# Patient Record
Sex: Female | Born: 1935 | Hispanic: No | Marital: Married | Smoking: Never smoker
Health system: Southern US, Community
[De-identification: ages and names within clinical notes are randomized; demographics above are authoritative.]

## PROBLEM LIST (undated history)

## (undated) DIAGNOSIS — I10 Essential (primary) hypertension: Secondary | ICD-10-CM

## (undated) DIAGNOSIS — G609 Hereditary and idiopathic neuropathy, unspecified: Secondary | ICD-10-CM

## (undated) DIAGNOSIS — I639 Cerebral infarction, unspecified: Secondary | ICD-10-CM

## (undated) HISTORY — DX: Cerebral infarction, unspecified: I63.9

## (undated) HISTORY — DX: Hereditary and idiopathic neuropathy, unspecified: G60.9

## (undated) HISTORY — PX: LAPAROSCOPIC ASSISTED VAGINAL HYSTERECTOMY: SHX5398

---

## 1997-08-19 ENCOUNTER — Other Ambulatory Visit: Admission: RE | Admit: 1997-08-19 | Discharge: 1997-08-19 | Payer: Self-pay | Admitting: Obstetrics and Gynecology

## 1998-09-15 ENCOUNTER — Other Ambulatory Visit: Admission: RE | Admit: 1998-09-15 | Discharge: 1998-09-15 | Payer: Self-pay | Admitting: Obstetrics and Gynecology

## 1999-10-05 ENCOUNTER — Other Ambulatory Visit: Admission: RE | Admit: 1999-10-05 | Discharge: 1999-10-05 | Payer: Self-pay | Admitting: Obstetrics and Gynecology

## 2000-10-08 ENCOUNTER — Other Ambulatory Visit: Admission: RE | Admit: 2000-10-08 | Discharge: 2000-10-08 | Payer: Self-pay | Admitting: Obstetrics and Gynecology

## 2011-06-14 DIAGNOSIS — D510 Vitamin B12 deficiency anemia due to intrinsic factor deficiency: Secondary | ICD-10-CM | POA: Diagnosis not present

## 2011-07-09 DIAGNOSIS — H2510 Age-related nuclear cataract, unspecified eye: Secondary | ICD-10-CM | POA: Diagnosis not present

## 2011-08-14 DIAGNOSIS — Z1331 Encounter for screening for depression: Secondary | ICD-10-CM | POA: Diagnosis not present

## 2011-08-14 DIAGNOSIS — G609 Hereditary and idiopathic neuropathy, unspecified: Secondary | ICD-10-CM | POA: Diagnosis not present

## 2011-08-14 DIAGNOSIS — I10 Essential (primary) hypertension: Secondary | ICD-10-CM | POA: Diagnosis not present

## 2011-08-14 DIAGNOSIS — E538 Deficiency of other specified B group vitamins: Secondary | ICD-10-CM | POA: Diagnosis not present

## 2011-08-16 DIAGNOSIS — D510 Vitamin B12 deficiency anemia due to intrinsic factor deficiency: Secondary | ICD-10-CM | POA: Diagnosis not present

## 2011-08-17 DIAGNOSIS — D239 Other benign neoplasm of skin, unspecified: Secondary | ICD-10-CM | POA: Diagnosis not present

## 2011-08-17 DIAGNOSIS — Z85828 Personal history of other malignant neoplasm of skin: Secondary | ICD-10-CM | POA: Diagnosis not present

## 2011-08-17 DIAGNOSIS — L821 Other seborrheic keratosis: Secondary | ICD-10-CM | POA: Diagnosis not present

## 2011-08-30 DIAGNOSIS — Z1231 Encounter for screening mammogram for malignant neoplasm of breast: Secondary | ICD-10-CM | POA: Diagnosis not present

## 2011-09-20 DIAGNOSIS — D510 Vitamin B12 deficiency anemia due to intrinsic factor deficiency: Secondary | ICD-10-CM | POA: Diagnosis not present

## 2011-10-22 DIAGNOSIS — D510 Vitamin B12 deficiency anemia due to intrinsic factor deficiency: Secondary | ICD-10-CM | POA: Diagnosis not present

## 2011-11-22 DIAGNOSIS — D510 Vitamin B12 deficiency anemia due to intrinsic factor deficiency: Secondary | ICD-10-CM | POA: Diagnosis not present

## 2011-12-24 DIAGNOSIS — D510 Vitamin B12 deficiency anemia due to intrinsic factor deficiency: Secondary | ICD-10-CM | POA: Diagnosis not present

## 2011-12-24 DIAGNOSIS — N390 Urinary tract infection, site not specified: Secondary | ICD-10-CM | POA: Diagnosis not present

## 2012-01-28 DIAGNOSIS — D510 Vitamin B12 deficiency anemia due to intrinsic factor deficiency: Secondary | ICD-10-CM | POA: Diagnosis not present

## 2012-02-18 DIAGNOSIS — I10 Essential (primary) hypertension: Secondary | ICD-10-CM | POA: Diagnosis not present

## 2012-02-18 DIAGNOSIS — M899 Disorder of bone, unspecified: Secondary | ICD-10-CM | POA: Diagnosis not present

## 2012-02-18 DIAGNOSIS — G609 Hereditary and idiopathic neuropathy, unspecified: Secondary | ICD-10-CM | POA: Diagnosis not present

## 2012-02-18 DIAGNOSIS — M949 Disorder of cartilage, unspecified: Secondary | ICD-10-CM | POA: Diagnosis not present

## 2012-02-18 DIAGNOSIS — Z23 Encounter for immunization: Secondary | ICD-10-CM | POA: Diagnosis not present

## 2012-02-28 DIAGNOSIS — D510 Vitamin B12 deficiency anemia due to intrinsic factor deficiency: Secondary | ICD-10-CM | POA: Diagnosis not present

## 2012-04-24 DIAGNOSIS — D510 Vitamin B12 deficiency anemia due to intrinsic factor deficiency: Secondary | ICD-10-CM | POA: Diagnosis not present

## 2012-06-24 DIAGNOSIS — D510 Vitamin B12 deficiency anemia due to intrinsic factor deficiency: Secondary | ICD-10-CM | POA: Diagnosis not present

## 2012-07-09 DIAGNOSIS — Z1289 Encounter for screening for malignant neoplasm of other sites: Secondary | ICD-10-CM | POA: Diagnosis not present

## 2012-07-25 DIAGNOSIS — Z1211 Encounter for screening for malignant neoplasm of colon: Secondary | ICD-10-CM | POA: Diagnosis not present

## 2012-08-21 DIAGNOSIS — D219 Benign neoplasm of connective and other soft tissue, unspecified: Secondary | ICD-10-CM | POA: Diagnosis not present

## 2012-08-21 DIAGNOSIS — Z85828 Personal history of other malignant neoplasm of skin: Secondary | ICD-10-CM | POA: Diagnosis not present

## 2012-08-21 DIAGNOSIS — L821 Other seborrheic keratosis: Secondary | ICD-10-CM | POA: Diagnosis not present

## 2012-08-21 DIAGNOSIS — D485 Neoplasm of uncertain behavior of skin: Secondary | ICD-10-CM | POA: Diagnosis not present

## 2012-08-21 DIAGNOSIS — D239 Other benign neoplasm of skin, unspecified: Secondary | ICD-10-CM | POA: Diagnosis not present

## 2012-08-27 DIAGNOSIS — Z1331 Encounter for screening for depression: Secondary | ICD-10-CM | POA: Diagnosis not present

## 2012-08-27 DIAGNOSIS — I10 Essential (primary) hypertension: Secondary | ICD-10-CM | POA: Diagnosis not present

## 2012-08-27 DIAGNOSIS — D510 Vitamin B12 deficiency anemia due to intrinsic factor deficiency: Secondary | ICD-10-CM | POA: Diagnosis not present

## 2012-08-27 DIAGNOSIS — M899 Disorder of bone, unspecified: Secondary | ICD-10-CM | POA: Diagnosis not present

## 2012-09-24 DIAGNOSIS — Z1231 Encounter for screening mammogram for malignant neoplasm of breast: Secondary | ICD-10-CM | POA: Diagnosis not present

## 2012-10-20 DIAGNOSIS — H2510 Age-related nuclear cataract, unspecified eye: Secondary | ICD-10-CM | POA: Diagnosis not present

## 2012-11-10 DIAGNOSIS — D510 Vitamin B12 deficiency anemia due to intrinsic factor deficiency: Secondary | ICD-10-CM | POA: Diagnosis not present

## 2013-01-12 DIAGNOSIS — D510 Vitamin B12 deficiency anemia due to intrinsic factor deficiency: Secondary | ICD-10-CM | POA: Diagnosis not present

## 2013-03-02 DIAGNOSIS — Z23 Encounter for immunization: Secondary | ICD-10-CM | POA: Diagnosis not present

## 2013-03-02 DIAGNOSIS — M899 Disorder of bone, unspecified: Secondary | ICD-10-CM | POA: Diagnosis not present

## 2013-03-02 DIAGNOSIS — I10 Essential (primary) hypertension: Secondary | ICD-10-CM | POA: Diagnosis not present

## 2013-05-13 DIAGNOSIS — D510 Vitamin B12 deficiency anemia due to intrinsic factor deficiency: Secondary | ICD-10-CM | POA: Diagnosis not present

## 2013-07-06 DIAGNOSIS — D510 Vitamin B12 deficiency anemia due to intrinsic factor deficiency: Secondary | ICD-10-CM | POA: Diagnosis not present

## 2013-08-31 DIAGNOSIS — K219 Gastro-esophageal reflux disease without esophagitis: Secondary | ICD-10-CM | POA: Diagnosis not present

## 2013-08-31 DIAGNOSIS — Z1331 Encounter for screening for depression: Secondary | ICD-10-CM | POA: Diagnosis not present

## 2013-08-31 DIAGNOSIS — D510 Vitamin B12 deficiency anemia due to intrinsic factor deficiency: Secondary | ICD-10-CM | POA: Diagnosis not present

## 2013-08-31 DIAGNOSIS — I10 Essential (primary) hypertension: Secondary | ICD-10-CM | POA: Diagnosis not present

## 2013-09-09 DIAGNOSIS — L821 Other seborrheic keratosis: Secondary | ICD-10-CM | POA: Diagnosis not present

## 2013-09-09 DIAGNOSIS — D485 Neoplasm of uncertain behavior of skin: Secondary | ICD-10-CM | POA: Diagnosis not present

## 2013-09-09 DIAGNOSIS — D239 Other benign neoplasm of skin, unspecified: Secondary | ICD-10-CM | POA: Diagnosis not present

## 2013-09-09 DIAGNOSIS — D2370 Other benign neoplasm of skin of unspecified lower limb, including hip: Secondary | ICD-10-CM | POA: Diagnosis not present

## 2013-09-09 DIAGNOSIS — L739 Follicular disorder, unspecified: Secondary | ICD-10-CM | POA: Diagnosis not present

## 2013-09-09 DIAGNOSIS — L570 Actinic keratosis: Secondary | ICD-10-CM | POA: Diagnosis not present

## 2013-09-09 DIAGNOSIS — Z85828 Personal history of other malignant neoplasm of skin: Secondary | ICD-10-CM | POA: Diagnosis not present

## 2013-09-30 DIAGNOSIS — Z1231 Encounter for screening mammogram for malignant neoplasm of breast: Secondary | ICD-10-CM | POA: Diagnosis not present

## 2013-10-28 DIAGNOSIS — N951 Menopausal and female climacteric states: Secondary | ICD-10-CM | POA: Diagnosis not present

## 2013-11-09 DIAGNOSIS — D510 Vitamin B12 deficiency anemia due to intrinsic factor deficiency: Secondary | ICD-10-CM | POA: Diagnosis not present

## 2013-11-11 DIAGNOSIS — L570 Actinic keratosis: Secondary | ICD-10-CM | POA: Diagnosis not present

## 2013-11-11 DIAGNOSIS — L820 Inflamed seborrheic keratosis: Secondary | ICD-10-CM | POA: Diagnosis not present

## 2014-01-12 DIAGNOSIS — D510 Vitamin B12 deficiency anemia due to intrinsic factor deficiency: Secondary | ICD-10-CM | POA: Diagnosis not present

## 2014-03-03 DIAGNOSIS — H10023 Other mucopurulent conjunctivitis, bilateral: Secondary | ICD-10-CM | POA: Diagnosis not present

## 2014-03-17 DIAGNOSIS — Z23 Encounter for immunization: Secondary | ICD-10-CM | POA: Diagnosis not present

## 2014-03-17 DIAGNOSIS — D510 Vitamin B12 deficiency anemia due to intrinsic factor deficiency: Secondary | ICD-10-CM | POA: Diagnosis not present

## 2014-03-17 DIAGNOSIS — D81819 Biotin-dependent carboxylase deficiency, unspecified: Secondary | ICD-10-CM | POA: Diagnosis not present

## 2014-03-17 DIAGNOSIS — J309 Allergic rhinitis, unspecified: Secondary | ICD-10-CM | POA: Diagnosis not present

## 2014-03-17 DIAGNOSIS — I10 Essential (primary) hypertension: Secondary | ICD-10-CM | POA: Diagnosis not present

## 2014-03-22 DIAGNOSIS — D2372 Other benign neoplasm of skin of left lower limb, including hip: Secondary | ICD-10-CM | POA: Diagnosis not present

## 2014-03-22 DIAGNOSIS — Z85828 Personal history of other malignant neoplasm of skin: Secondary | ICD-10-CM | POA: Diagnosis not present

## 2014-03-22 DIAGNOSIS — T881XXD Other complications following immunization, not elsewhere classified, subsequent encounter: Secondary | ICD-10-CM | POA: Diagnosis not present

## 2014-03-22 DIAGNOSIS — D1801 Hemangioma of skin and subcutaneous tissue: Secondary | ICD-10-CM | POA: Diagnosis not present

## 2014-03-22 DIAGNOSIS — D239 Other benign neoplasm of skin, unspecified: Secondary | ICD-10-CM | POA: Diagnosis not present

## 2014-05-19 DIAGNOSIS — D510 Vitamin B12 deficiency anemia due to intrinsic factor deficiency: Secondary | ICD-10-CM | POA: Diagnosis not present

## 2014-07-19 DIAGNOSIS — D510 Vitamin B12 deficiency anemia due to intrinsic factor deficiency: Secondary | ICD-10-CM | POA: Diagnosis not present

## 2014-09-23 DIAGNOSIS — G609 Hereditary and idiopathic neuropathy, unspecified: Secondary | ICD-10-CM | POA: Diagnosis not present

## 2014-09-23 DIAGNOSIS — I10 Essential (primary) hypertension: Secondary | ICD-10-CM | POA: Diagnosis not present

## 2014-09-23 DIAGNOSIS — D81819 Biotin-dependent carboxylase deficiency, unspecified: Secondary | ICD-10-CM | POA: Diagnosis not present

## 2014-10-07 DIAGNOSIS — Z1231 Encounter for screening mammogram for malignant neoplasm of breast: Secondary | ICD-10-CM | POA: Diagnosis not present

## 2015-02-24 DIAGNOSIS — Z85828 Personal history of other malignant neoplasm of skin: Secondary | ICD-10-CM | POA: Diagnosis not present

## 2015-02-24 DIAGNOSIS — D1800 Hemangioma unspecified site: Secondary | ICD-10-CM | POA: Diagnosis not present

## 2015-02-24 DIAGNOSIS — D2262 Melanocytic nevi of left upper limb, including shoulder: Secondary | ICD-10-CM | POA: Diagnosis not present

## 2015-02-24 DIAGNOSIS — D225 Melanocytic nevi of trunk: Secondary | ICD-10-CM | POA: Diagnosis not present

## 2015-02-24 DIAGNOSIS — D2372 Other benign neoplasm of skin of left lower limb, including hip: Secondary | ICD-10-CM | POA: Diagnosis not present

## 2015-03-03 DIAGNOSIS — Z23 Encounter for immunization: Secondary | ICD-10-CM | POA: Diagnosis not present

## 2015-03-08 DIAGNOSIS — H16223 Keratoconjunctivitis sicca, not specified as Sjogren's, bilateral: Secondary | ICD-10-CM | POA: Diagnosis not present

## 2015-03-28 DIAGNOSIS — E538 Deficiency of other specified B group vitamins: Secondary | ICD-10-CM | POA: Diagnosis not present

## 2015-03-28 DIAGNOSIS — I10 Essential (primary) hypertension: Secondary | ICD-10-CM | POA: Diagnosis not present

## 2015-03-28 DIAGNOSIS — G609 Hereditary and idiopathic neuropathy, unspecified: Secondary | ICD-10-CM | POA: Diagnosis not present

## 2015-04-13 DIAGNOSIS — N951 Menopausal and female climacteric states: Secondary | ICD-10-CM | POA: Diagnosis not present

## 2015-05-24 DIAGNOSIS — L408 Other psoriasis: Secondary | ICD-10-CM | POA: Diagnosis not present

## 2015-06-01 DIAGNOSIS — H16223 Keratoconjunctivitis sicca, not specified as Sjogren's, bilateral: Secondary | ICD-10-CM | POA: Diagnosis not present

## 2015-06-23 DIAGNOSIS — L408 Other psoriasis: Secondary | ICD-10-CM | POA: Diagnosis not present

## 2015-07-04 DIAGNOSIS — D510 Vitamin B12 deficiency anemia due to intrinsic factor deficiency: Secondary | ICD-10-CM | POA: Diagnosis not present

## 2015-09-01 DIAGNOSIS — D510 Vitamin B12 deficiency anemia due to intrinsic factor deficiency: Secondary | ICD-10-CM | POA: Diagnosis not present

## 2015-09-27 DIAGNOSIS — G609 Hereditary and idiopathic neuropathy, unspecified: Secondary | ICD-10-CM | POA: Diagnosis not present

## 2015-09-27 DIAGNOSIS — Z1389 Encounter for screening for other disorder: Secondary | ICD-10-CM | POA: Diagnosis not present

## 2015-09-27 DIAGNOSIS — Z0000 Encounter for general adult medical examination without abnormal findings: Secondary | ICD-10-CM | POA: Diagnosis not present

## 2015-09-27 DIAGNOSIS — I10 Essential (primary) hypertension: Secondary | ICD-10-CM | POA: Diagnosis not present

## 2015-10-10 DIAGNOSIS — Z1231 Encounter for screening mammogram for malignant neoplasm of breast: Secondary | ICD-10-CM | POA: Diagnosis not present

## 2015-10-31 DIAGNOSIS — D510 Vitamin B12 deficiency anemia due to intrinsic factor deficiency: Secondary | ICD-10-CM | POA: Diagnosis not present

## 2015-12-14 DIAGNOSIS — J209 Acute bronchitis, unspecified: Secondary | ICD-10-CM | POA: Diagnosis not present

## 2016-01-02 DIAGNOSIS — D510 Vitamin B12 deficiency anemia due to intrinsic factor deficiency: Secondary | ICD-10-CM | POA: Diagnosis not present

## 2016-01-03 DIAGNOSIS — H2513 Age-related nuclear cataract, bilateral: Secondary | ICD-10-CM | POA: Diagnosis not present

## 2016-01-03 DIAGNOSIS — H04123 Dry eye syndrome of bilateral lacrimal glands: Secondary | ICD-10-CM | POA: Diagnosis not present

## 2016-01-19 DIAGNOSIS — Z01818 Encounter for other preprocedural examination: Secondary | ICD-10-CM | POA: Diagnosis not present

## 2016-01-19 DIAGNOSIS — H2513 Age-related nuclear cataract, bilateral: Secondary | ICD-10-CM | POA: Diagnosis not present

## 2016-01-23 DIAGNOSIS — H2511 Age-related nuclear cataract, right eye: Secondary | ICD-10-CM | POA: Diagnosis not present

## 2016-01-23 DIAGNOSIS — H25811 Combined forms of age-related cataract, right eye: Secondary | ICD-10-CM | POA: Diagnosis not present

## 2016-02-29 DIAGNOSIS — D2372 Other benign neoplasm of skin of left lower limb, including hip: Secondary | ICD-10-CM | POA: Diagnosis not present

## 2016-02-29 DIAGNOSIS — D2262 Melanocytic nevi of left upper limb, including shoulder: Secondary | ICD-10-CM | POA: Diagnosis not present

## 2016-02-29 DIAGNOSIS — Z85828 Personal history of other malignant neoplasm of skin: Secondary | ICD-10-CM | POA: Diagnosis not present

## 2016-02-29 DIAGNOSIS — D225 Melanocytic nevi of trunk: Secondary | ICD-10-CM | POA: Diagnosis not present

## 2016-02-29 DIAGNOSIS — L821 Other seborrheic keratosis: Secondary | ICD-10-CM | POA: Diagnosis not present

## 2016-02-29 DIAGNOSIS — Z23 Encounter for immunization: Secondary | ICD-10-CM | POA: Diagnosis not present

## 2016-03-01 DIAGNOSIS — Z23 Encounter for immunization: Secondary | ICD-10-CM | POA: Diagnosis not present

## 2016-03-12 DIAGNOSIS — D510 Vitamin B12 deficiency anemia due to intrinsic factor deficiency: Secondary | ICD-10-CM | POA: Diagnosis not present

## 2016-03-22 DIAGNOSIS — H25812 Combined forms of age-related cataract, left eye: Secondary | ICD-10-CM | POA: Diagnosis not present

## 2016-03-22 DIAGNOSIS — H2512 Age-related nuclear cataract, left eye: Secondary | ICD-10-CM | POA: Diagnosis not present

## 2016-04-05 DIAGNOSIS — I10 Essential (primary) hypertension: Secondary | ICD-10-CM | POA: Diagnosis not present

## 2016-04-16 DIAGNOSIS — I10 Essential (primary) hypertension: Secondary | ICD-10-CM | POA: Diagnosis not present

## 2016-04-27 DIAGNOSIS — J209 Acute bronchitis, unspecified: Secondary | ICD-10-CM | POA: Diagnosis not present

## 2016-05-08 DIAGNOSIS — Z1289 Encounter for screening for malignant neoplasm of other sites: Secondary | ICD-10-CM | POA: Diagnosis not present

## 2016-05-14 DIAGNOSIS — D510 Vitamin B12 deficiency anemia due to intrinsic factor deficiency: Secondary | ICD-10-CM | POA: Diagnosis not present

## 2016-07-16 DIAGNOSIS — D510 Vitamin B12 deficiency anemia due to intrinsic factor deficiency: Secondary | ICD-10-CM | POA: Diagnosis not present

## 2016-09-18 DIAGNOSIS — J209 Acute bronchitis, unspecified: Secondary | ICD-10-CM | POA: Diagnosis not present

## 2016-10-10 DIAGNOSIS — Z1389 Encounter for screening for other disorder: Secondary | ICD-10-CM | POA: Diagnosis not present

## 2016-10-10 DIAGNOSIS — I10 Essential (primary) hypertension: Secondary | ICD-10-CM | POA: Diagnosis not present

## 2016-10-10 DIAGNOSIS — Z0000 Encounter for general adult medical examination without abnormal findings: Secondary | ICD-10-CM | POA: Diagnosis not present

## 2016-10-10 DIAGNOSIS — D510 Vitamin B12 deficiency anemia due to intrinsic factor deficiency: Secondary | ICD-10-CM | POA: Diagnosis not present

## 2016-12-10 DIAGNOSIS — E538 Deficiency of other specified B group vitamins: Secondary | ICD-10-CM | POA: Diagnosis not present

## 2016-12-10 DIAGNOSIS — I8392 Asymptomatic varicose veins of left lower extremity: Secondary | ICD-10-CM | POA: Diagnosis not present

## 2017-01-23 DIAGNOSIS — Z1231 Encounter for screening mammogram for malignant neoplasm of breast: Secondary | ICD-10-CM | POA: Diagnosis not present

## 2017-02-11 DIAGNOSIS — D510 Vitamin B12 deficiency anemia due to intrinsic factor deficiency: Secondary | ICD-10-CM | POA: Diagnosis not present

## 2017-02-11 DIAGNOSIS — Z23 Encounter for immunization: Secondary | ICD-10-CM | POA: Diagnosis not present

## 2017-02-28 DIAGNOSIS — L738 Other specified follicular disorders: Secondary | ICD-10-CM | POA: Diagnosis not present

## 2017-02-28 DIAGNOSIS — D2372 Other benign neoplasm of skin of left lower limb, including hip: Secondary | ICD-10-CM | POA: Diagnosis not present

## 2017-02-28 DIAGNOSIS — D1721 Benign lipomatous neoplasm of skin and subcutaneous tissue of right arm: Secondary | ICD-10-CM | POA: Diagnosis not present

## 2017-02-28 DIAGNOSIS — D2262 Melanocytic nevi of left upper limb, including shoulder: Secondary | ICD-10-CM | POA: Diagnosis not present

## 2017-02-28 DIAGNOSIS — Z23 Encounter for immunization: Secondary | ICD-10-CM | POA: Diagnosis not present

## 2017-02-28 DIAGNOSIS — D225 Melanocytic nevi of trunk: Secondary | ICD-10-CM | POA: Diagnosis not present

## 2017-02-28 DIAGNOSIS — B353 Tinea pedis: Secondary | ICD-10-CM | POA: Diagnosis not present

## 2017-02-28 DIAGNOSIS — L700 Acne vulgaris: Secondary | ICD-10-CM | POA: Diagnosis not present

## 2017-02-28 DIAGNOSIS — I8390 Asymptomatic varicose veins of unspecified lower extremity: Secondary | ICD-10-CM | POA: Diagnosis not present

## 2017-02-28 DIAGNOSIS — R609 Edema, unspecified: Secondary | ICD-10-CM | POA: Diagnosis not present

## 2017-02-28 DIAGNOSIS — L821 Other seborrheic keratosis: Secondary | ICD-10-CM | POA: Diagnosis not present

## 2017-04-08 DIAGNOSIS — E538 Deficiency of other specified B group vitamins: Secondary | ICD-10-CM | POA: Diagnosis not present

## 2017-04-08 DIAGNOSIS — I10 Essential (primary) hypertension: Secondary | ICD-10-CM | POA: Diagnosis not present

## 2017-04-08 DIAGNOSIS — D510 Vitamin B12 deficiency anemia due to intrinsic factor deficiency: Secondary | ICD-10-CM | POA: Diagnosis not present

## 2017-06-03 DIAGNOSIS — H2703 Aphakia, bilateral: Secondary | ICD-10-CM | POA: Diagnosis not present

## 2017-06-03 DIAGNOSIS — H04123 Dry eye syndrome of bilateral lacrimal glands: Secondary | ICD-10-CM | POA: Diagnosis not present

## 2017-07-01 DIAGNOSIS — D510 Vitamin B12 deficiency anemia due to intrinsic factor deficiency: Secondary | ICD-10-CM | POA: Diagnosis not present

## 2017-08-26 DIAGNOSIS — D510 Vitamin B12 deficiency anemia due to intrinsic factor deficiency: Secondary | ICD-10-CM | POA: Diagnosis not present

## 2017-10-21 DIAGNOSIS — I10 Essential (primary) hypertension: Secondary | ICD-10-CM | POA: Diagnosis not present

## 2017-10-21 DIAGNOSIS — Z1389 Encounter for screening for other disorder: Secondary | ICD-10-CM | POA: Diagnosis not present

## 2017-10-21 DIAGNOSIS — Z0000 Encounter for general adult medical examination without abnormal findings: Secondary | ICD-10-CM | POA: Diagnosis not present

## 2017-10-21 DIAGNOSIS — D510 Vitamin B12 deficiency anemia due to intrinsic factor deficiency: Secondary | ICD-10-CM | POA: Diagnosis not present

## 2017-10-21 DIAGNOSIS — M8580 Other specified disorders of bone density and structure, unspecified site: Secondary | ICD-10-CM | POA: Diagnosis not present

## 2017-10-28 DIAGNOSIS — I10 Essential (primary) hypertension: Secondary | ICD-10-CM | POA: Diagnosis not present

## 2017-12-16 DIAGNOSIS — M8588 Other specified disorders of bone density and structure, other site: Secondary | ICD-10-CM | POA: Diagnosis not present

## 2017-12-16 DIAGNOSIS — E538 Deficiency of other specified B group vitamins: Secondary | ICD-10-CM | POA: Diagnosis not present

## 2018-01-02 ENCOUNTER — Inpatient Hospital Stay (HOSPITAL_COMMUNITY): Payer: Medicare Other

## 2018-01-02 ENCOUNTER — Emergency Department (HOSPITAL_COMMUNITY): Payer: Medicare Other

## 2018-01-02 ENCOUNTER — Encounter (HOSPITAL_COMMUNITY): Payer: Self-pay | Admitting: *Deleted

## 2018-01-02 ENCOUNTER — Other Ambulatory Visit: Payer: Self-pay

## 2018-01-02 ENCOUNTER — Inpatient Hospital Stay (HOSPITAL_COMMUNITY)
Admission: EM | Admit: 2018-01-02 | Discharge: 2018-01-03 | DRG: 065 | Disposition: A | Discharge status: Home / Self Care | Payer: Medicare Other | Attending: Internal Medicine | Admitting: Internal Medicine

## 2018-01-02 DIAGNOSIS — R29810 Facial weakness: Secondary | ICD-10-CM | POA: Diagnosis present

## 2018-01-02 DIAGNOSIS — Z90710 Acquired absence of both cervix and uterus: Secondary | ICD-10-CM

## 2018-01-02 DIAGNOSIS — I639 Cerebral infarction, unspecified: Secondary | ICD-10-CM | POA: Diagnosis present

## 2018-01-02 DIAGNOSIS — Z888 Allergy status to other drugs, medicaments and biological substances status: Secondary | ICD-10-CM

## 2018-01-02 DIAGNOSIS — R29701 NIHSS score 1: Secondary | ICD-10-CM | POA: Diagnosis present

## 2018-01-02 DIAGNOSIS — I10 Essential (primary) hypertension: Secondary | ICD-10-CM | POA: Diagnosis present

## 2018-01-02 DIAGNOSIS — I6381 Other cerebral infarction due to occlusion or stenosis of small artery: Secondary | ICD-10-CM

## 2018-01-02 DIAGNOSIS — I63312 Cerebral infarction due to thrombosis of left middle cerebral artery: Secondary | ICD-10-CM

## 2018-01-02 DIAGNOSIS — G8191 Hemiplegia, unspecified affecting right dominant side: Secondary | ICD-10-CM | POA: Diagnosis present

## 2018-01-02 DIAGNOSIS — Z79890 Hormone replacement therapy (postmenopausal): Secondary | ICD-10-CM | POA: Diagnosis not present

## 2018-01-02 DIAGNOSIS — E785 Hyperlipidemia, unspecified: Secondary | ICD-10-CM | POA: Diagnosis not present

## 2018-01-02 DIAGNOSIS — I6523 Occlusion and stenosis of bilateral carotid arteries: Secondary | ICD-10-CM | POA: Diagnosis not present

## 2018-01-02 DIAGNOSIS — I340 Nonrheumatic mitral (valve) insufficiency: Secondary | ICD-10-CM | POA: Diagnosis not present

## 2018-01-02 DIAGNOSIS — Z79899 Other long term (current) drug therapy: Secondary | ICD-10-CM

## 2018-01-02 DIAGNOSIS — Z7902 Long term (current) use of antithrombotics/antiplatelets: Secondary | ICD-10-CM | POA: Diagnosis not present

## 2018-01-02 HISTORY — DX: Essential (primary) hypertension: I10

## 2018-01-02 LAB — COMPREHENSIVE METABOLIC PANEL
ALK PHOS: 72 U/L (ref 38–126)
ALT: 17 U/L (ref 0–44)
AST: 24 U/L (ref 15–41)
Albumin: 3.8 g/dL (ref 3.5–5.0)
Anion gap: 9 (ref 5–15)
BUN: 14 mg/dL (ref 8–23)
CALCIUM: 9.4 mg/dL (ref 8.9–10.3)
CO2: 27 mmol/L (ref 22–32)
CREATININE: 0.87 mg/dL (ref 0.44–1.00)
Chloride: 104 mmol/L (ref 98–111)
GFR calc non Af Amer: >60 mL/min (ref >60)
GLUCOSE: 99 mg/dL (ref 70–99)
Potassium: 3.7 mmol/L (ref 3.5–5.1)
SODIUM: 140 mmol/L (ref 135–145)
Total Bilirubin: 0.6 mg/dL (ref 0.3–1.2)
Total Protein: 7.1 g/dL (ref 6.5–8.1)

## 2018-01-02 LAB — DIFFERENTIAL
Abs Immature Granulocytes: 0 10*3/uL (ref 0.0–0.1)
BASOS ABS: 0 10*3/uL (ref 0.0–0.1)
Basophils Relative: 0 %
Eosinophils Absolute: 0 10*3/uL (ref 0.0–0.7)
Eosinophils Relative: 0 %
Immature Granulocytes: 0 %
LYMPHS PCT: 25 %
Lymphs Abs: 2.5 10*3/uL (ref 0.7–4.0)
MONO ABS: 0.8 10*3/uL (ref 0.1–1.0)
Monocytes Relative: 8 %
NEUTROS ABS: 6.5 10*3/uL (ref 1.7–7.7)
Neutrophils Relative %: 67 %

## 2018-01-02 LAB — PROTIME-INR
INR: 0.91
Prothrombin Time: 12.2 seconds (ref 11.4–15.2)

## 2018-01-02 LAB — CBC
HEMATOCRIT: 40.5 % (ref 36.0–46.0)
HEMOGLOBIN: 13.5 g/dL (ref 12.0–15.0)
MCH: 30.4 pg (ref 26.0–34.0)
MCHC: 33.3 g/dL (ref 30.0–36.0)
MCV: 91.2 fL (ref 78.0–100.0)
Platelets: 306 10*3/uL (ref 150–400)
RBC: 4.44 MIL/uL (ref 3.87–5.11)
RDW: 12.8 % (ref 11.5–15.5)
WBC: 9.8 10*3/uL (ref 4.0–10.5)

## 2018-01-02 LAB — I-STAT CHEM 8, ED
BUN: 20 mg/dL (ref 8–23)
CALCIUM ION: 1.18 mmol/L (ref 1.15–1.40)
CHLORIDE: 103 mmol/L (ref 98–111)
CREATININE: 0.9 mg/dL (ref 0.44–1.00)
GLUCOSE: 96 mg/dL (ref 70–99)
HCT: 40 % (ref 36.0–46.0)
Hemoglobin: 13.6 g/dL (ref 12.0–15.0)
Potassium: 3.9 mmol/L (ref 3.5–5.1)
Sodium: 139 mmol/L (ref 135–145)
TCO2: 28 mmol/L (ref 22–32)

## 2018-01-02 LAB — I-STAT TROPONIN, ED: Troponin i, poc: 0 ng/mL (ref 0.00–0.08)

## 2018-01-02 LAB — APTT: APTT: 28 s (ref 24–36)

## 2018-01-02 MED ORDER — SENNOSIDES-DOCUSATE SODIUM 8.6-50 MG PO TABS: 1.0000 | ORAL_TABLET | Freq: Every evening | ORAL | Status: DC | PRN

## 2018-01-02 MED ORDER — ASPIRIN 325 MG PO TABS: 325.0000 mg | ORAL_TABLET | Freq: Every day | ORAL | Status: DC

## 2018-01-02 MED ORDER — ATORVASTATIN CALCIUM 80 MG PO TABS
80.0000 mg | ORAL_TABLET | Freq: Every day | ORAL | Status: DC
  Administered 2018-01-02: 80 mg via ORAL
  Filled 2018-01-02: qty 1

## 2018-01-02 MED ORDER — IOPAMIDOL (ISOVUE-370) INJECTION 76%
50.0000 mL | Freq: Once | INTRAVENOUS | Status: AC | PRN
  Administered 2018-01-02: 50 mL via INTRAVENOUS

## 2018-01-02 MED ORDER — VITAMIN D 1000 UNITS PO TABS
1000.0000 [IU] | ORAL_TABLET | Freq: Every day | ORAL | Status: DC
  Administered 2018-01-03: 1000 [IU] via ORAL
  Filled 2018-01-02: qty 1

## 2018-01-02 MED ORDER — METOPROLOL SUCCINATE ER 100 MG PO TB24: 100.0000 mg | ORAL_TABLET | Freq: Every day | ORAL | Status: DC

## 2018-01-02 MED ORDER — LORATADINE 10 MG PO TABS: 10.0000 mg | ORAL_TABLET | Freq: Every day | ORAL | Status: DC | PRN

## 2018-01-02 MED ORDER — METOPROLOL SUCCINATE ER 25 MG PO TB24
25.0000 mg | ORAL_TABLET | Freq: Every day | ORAL | Status: DC
  Administered 2018-01-03: 25 mg via ORAL
  Filled 2018-01-02: qty 1

## 2018-01-02 MED ORDER — ACETAMINOPHEN 325 MG PO TABS: 650.0000 mg | ORAL_TABLET | ORAL | Status: DC | PRN

## 2018-01-02 MED ORDER — IOPAMIDOL (ISOVUE-370) INJECTION 76%
INTRAVENOUS | Status: AC
  Filled 2018-01-02: qty 50

## 2018-01-02 MED ORDER — STROKE: EARLY STAGES OF RECOVERY BOOK
Freq: Once | Status: DC
  Filled 2018-01-02: qty 1

## 2018-01-02 MED ORDER — HYDRALAZINE HCL 20 MG/ML IJ SOLN: 10.0000 mg | Freq: Four times a day (QID) | INTRAMUSCULAR | Status: DC | PRN

## 2018-01-02 MED ORDER — ACETAMINOPHEN 160 MG/5ML PO SOLN: 650.0000 mg | ORAL | Status: DC | PRN

## 2018-01-02 MED ORDER — ENOXAPARIN SODIUM 40 MG/0.4ML ~~LOC~~ SOLN
40.0000 mg | SUBCUTANEOUS | Status: DC
  Administered 2018-01-02: 40 mg via SUBCUTANEOUS
  Filled 2018-01-02: qty 0.4

## 2018-01-02 MED ORDER — ACETAMINOPHEN 650 MG RE SUPP: 650.0000 mg | RECTAL | Status: DC | PRN

## 2018-01-02 MED ORDER — ASPIRIN 300 MG RE SUPP: 300.0000 mg | Freq: Every day | RECTAL | Status: DC

## 2018-01-02 MED ORDER — CALCIUM CARBONATE-VITAMIN D 500-200 MG-UNIT PO TABS
1.0000 | ORAL_TABLET | Freq: Every day | ORAL | Status: DC
  Administered 2018-01-03: 1 via ORAL
  Filled 2018-01-02: qty 1

## 2018-01-02 NOTE — H&P (Signed)
History and Physical    DOA: 01/02/2018  PCP: Lavone Orn, MD  Patient coming from: Home  Chief Complaint: right sided weakness  HPI: CADENCE MINTON is a 83 y.o. female with history h/o hypertension who is on estrogen supplementation since her hysterectomy 35 years back, presents now with c/o right sided weakness.  Patient states, at baseline, she has some essential tremors in her right hand due to which she has difficulty holding things sometimes.  She woke up Sunday morning feeling heavy and weak in her right upper extremity/forearm which she related to possibly sleeping on that side.  She noticed that she had to use her left hand to move/coordinate her right hand to her face while make-up.  She figured that the symptoms would resolve over time.  She however noticed some right lower extremity weakness as well this morning which concerned her and brought her to the ED.  Husband bedside and reports noticing mild right facial droop on Monday.  Patient denies any dysphagia or dysarthria.  She denies any chest pain, shortness breath, palpitations.  She denies any history of hot flashes or osteoporosis.  She states she never stopped taking estrogen supplementation that was started 35 years back after her hysterectomy.  Denies any history of blood clots.  ED work-up revealed normal labs and CT scan showed a left lacunar infarct.  Patient seen by neurology and recommended admission for further stroke work-up.   Review of Systems: As per HPI otherwise 10 point review of systems negative.    Past Medical History:  Diagnosis Date  . Hypertension     History reviewed. No pertinent surgical history.  Social history:  reports that she has never smoked. She has never used smokeless tobacco. Her alcohol and drug histories are not on file.   Allergies  Allergen Reactions  . Lisinopril Swelling    Face was numb, lips was swollen.  . Prednisone Swelling    Face turns red.    Family History    Problem Relation Age of Onset  . Hypertension Mother   . Hypertension Father       Prior to Admission medications   Medication Sig Start Date End Date Taking? Authorizing Provider  Calcium Carb-Cholecalciferol (OS-CAL EXTRA D3) 500-600 MG-UNIT TABS Take 1 tablet by mouth daily.   Yes [provider]  cholecalciferol (VITAMIN D) 1000 units tablet Take 1,000 Units by mouth daily.   Yes [provider]  estradiol (ESTRACE) 1 MG tablet Take 1 mg by mouth daily.   Yes [provider]  fluticasone (FLONASE) 50 MCG/ACT nasal spray Place 2 sprays into both nostrils daily as needed for allergies or rhinitis.   Yes [provider]  loratadine (CLARITIN) 10 MG tablet Take 10 mg by mouth daily as needed for allergies.    Yes [provider]  metoprolol succinate (TOPROL-XL) 100 MG 24 hr tablet Take 100 mg by mouth daily.   Yes [provider]    Physical Exam: Vitals:   01/02/18 1245 01/02/18 1430 01/02/18 1515 01/02/18 1715  BP: (!) 170/66  (!) 175/70 (!) 181/69  Pulse: 69  74 67  Resp: 12  13 11   Temp:  98.1 F (36.7 C)    TempSrc:      SpO2: 98%  97% 98%    Constitutional: NAD, calm, comfortable Vitals:   01/02/18 1245 01/02/18 1430 01/02/18 1515 01/02/18 1715  BP: (!) 170/66  (!) 175/70 (!) 181/69  Pulse: 69  74 67  Resp:  12  13 11   Temp:  98.1 F (36.7 C)    TempSrc:      SpO2: 98%  97% 98%   Eyes: PERRL, lids and conjunctivae normal ENMT: Mucous membranes are moist. Posterior pharynx clear of any exudate or lesions.Normal dentition.  Neck: normal, supple, no masses, no thyromegaly Respiratory: clear to auscultation bilaterally, no wheezing, no crackles. Normal respiratory effort. No accessory muscle use.  Cardiovascular: Regular rate and rhythm, no murmurs / rubs / gallops. No extremity edema. 2+ pedal pulses. No carotid bruits.  Abdomen: no tenderness, no masses palpated. No hepatosplenomegaly. Bowel sounds positive.   Musculoskeletal: no clubbing / cyanosis. No joint deformity upper and lower extremities. Good ROM, no contractures. Normal muscle tone.  Neurologic: Right facial droop, mild.  No ptosis.  No tongue deviation.  Right upper extremity strength 3-4 over 5, right lower extremity strength 5/5.  Finger-to-nose shows discoordination but likely due to weakness in the right hand. Psychiatric: Normal judgment and insight. Alert and oriented x 3. Normal mood.  SKIN/catheters: no rashes, lesions, ulcers. No induration  Labs on Admission: I have personally reviewed following labs and imaging studies  CBC: Recent Labs  Lab 01/02/18 1220 01/02/18 1236  WBC 9.8  --   NEUTROABS 6.5  --   HGB 13.5 13.6  HCT 40.5 40.0  MCV 91.2  --   PLT 306  --    Basic Metabolic Panel: Recent Labs  Lab 01/02/18 1220 01/02/18 1236  NA 140 139  K 3.7 3.9  CL 104 103  CO2 27  --   GLUCOSE 99 96  BUN 14 20  CREATININE 0.87 0.90  CALCIUM 9.4  --    GFR: CrCl cannot be calculated (Unknown ideal weight.). Liver Function Tests: Recent Labs  Lab 01/02/18 1220  AST 24  ALT 17  ALKPHOS 72  BILITOT 0.6  PROT 7.1  ALBUMIN 3.8   No results for input(s): LIPASE, AMYLASE in the last 168 hours. No results for input(s): AMMONIA in the last 168 hours. Coagulation Profile: Recent Labs  Lab 01/02/18 1220  INR 0.91   Cardiac Enzymes: No results for input(s): CKTOTAL, CKMB, CKMBINDEX, TROPONINI in the last 168 hours. BNP (last 3 results) No results for input(s): PROBNP in the last 8760 hours. HbA1C: No results for input(s): HGBA1C in the last 72 hours. CBG: No results for input(s): GLUCAP in the last 168 hours. Lipid Profile: No results for input(s): CHOL, HDL, LDLCALC, TRIG, CHOLHDL, LDLDIRECT in the last 72 hours. Thyroid Function Tests: No results for input(s): TSH, T4TOTAL, FREET4, T3FREE, THYROIDAB in the last 72 hours. Anemia Panel: No results for input(s): VITAMINB12, FOLATE, FERRITIN, TIBC, IRON,  RETICCTPCT in the last 72 hours. Urine analysis: No results found for: COLORURINE, APPEARANCEUR, [place], [place], GLUCOSEU, [place], BILIRUBINUR, KETONESUR, PROTEINUR, UROBILINOGEN, NITRITE, LEUKOCYTESUR  Radiological Exams on Admission: Ct Head Wo Contrast  Result Date: 01/02/2018 CLINICAL DATA:  Right arm and leg weakness with right facial droop. Symptoms began Sunday-Monday. EXAM: CT HEAD WITHOUT CONTRAST TECHNIQUE: Contiguous axial images were obtained from the base of the skull through the vertex without intravenous contrast. COMPARISON:  None. FINDINGS: Brain: Small lacunar infarct in the left basal ganglia. No hemorrhage, hydrocephalus, extra-axial collection or mass lesion/mass effect. Mild age related cerebral atrophy. Vascular: Atherosclerotic vascular calcification of the carotid siphons. No hyperdense vessel. Skull: Normal. Negative for fracture or focal lesion. Sinuses/Orbits: No acute finding. Other: None. IMPRESSION: 1. Small age-indeterminate lacunar infarct in the left basal ganglia. Electronically Signed  By: Titus Dubin M.D.   On: 01/02/2018 14:28    EKG: Independently reviewed.  Normal sinus rhythm with no acute ST-T changes     Assessment and Plan:   1.  Acute ischemic CVA: CT head shows small age-indeterminate lacunar infarct in left basal ganglia.  Patient is aspirin nave, will start aspirin at 325 mg with high-dose statins as recommended by neurology.  MRI head/MRA ordered for further evaluation of vasculature.  Given the timeline of events, likely do not need strict permissive hypertension.  Will maintain at goal systolic 1 94-4 70.  Lipid profile in a.m.  Neurochecks.  Echo, PT/OT consult requested  2.  Hypertension: Resume metoprolol at lower dose with hydralazine as needed as described above for blood pressure goals  3.  Post hysterectomy: Estrogen risks likely outweigh the benefits in this 83 year old female.  Advised to discuss with PCP.   DVT prophylaxis:  Lovenox  Code Status: Full code  Family Communication: Discussed with patient and husband bedside. Health care proxy would be her husband Consults called: Neurology Admission status:  Patient admitted as inpatient as anticipated LOS greater than 2 midnights    Guilford Shi MD [facility] Pager [phone]  If 7PM-7AM, please contact night-coverage [email or web address] Password [facility]  01/02/2018, 5:44 PM

## 2018-01-02 NOTE — ED Notes (Signed)
Pt is resting and appears comfortable with family at bedside.  Neuro PA at bedside.  Waiting for a bed assignment

## 2018-01-02 NOTE — ED Triage Notes (Signed)
Pt in c/o right arm weakness and right facial droop since Sunday, on Monday developed right leg weakness, pt called PCP this morning and was told to come in for evaluation, right arm weakness noted and facial droop, pt reports gait changes as well, pt alert and oriented

## 2018-01-02 NOTE — Consult Note (Addendum)
Neurology Consultation  Reason for Consult: Stroke Referring Physician: Sedonia Small  CC: Right arm weakness History is obtained from: Patient  HPI: Ashley Phillips is a 83 y.o. female with history of hypertension.  Patient states that she went to bed at 11:30 at night on Saturday and when she woke on Sunday she noted that her right arm was extremely weak in addition she kept dropping things.  She did not seek any medical attention until today.  She figured that the symptoms would resolve over time however due to and symptoms not resolving and continuing to have problems with fine motor skills patient came to the emergency department today to have further evaluation.  CT scan showed a left lacunar infarct.  LKW: 11:30 PM on 12/28/2017 tpa given?: no, out of the window Premorbid modified Rankin scale (mRS): 0 NIH stroke scale 1 ROS: A 14 point ROS was performed and is negative except as noted in the HPI.    Past Medical History:  Diagnosis Date  . Hypertension      Family History  Problem Relation Age of Onset  . Hypertension Mother   . Hypertension Father      Social History:   reports that she has never smoked. She has never used smokeless tobacco. Her alcohol and drug histories are not on file.  Medications No current facility-administered medications for this encounter.   Current Outpatient Medications:  .  Calcium Carb-Cholecalciferol (OS-CAL EXTRA D3) 500-600 MG-UNIT TABS, Take 1 tablet by mouth daily., Disp: , Rfl:  .  cholecalciferol (VITAMIN D) 1000 units tablet, Take 1,000 Units by mouth daily., Disp: , Rfl:  .  estradiol (ESTRACE) 1 MG tablet, Take 1 mg by mouth daily., Disp: , Rfl:  .  fluticasone (FLONASE) 50 MCG/ACT nasal spray, Place 2 sprays into both nostrils daily as needed for allergies or rhinitis., Disp: , Rfl:  .  loratadine (CLARITIN) 10 MG tablet, Take 10 mg by mouth daily as needed for allergies. , Disp: , Rfl:  .  metoprolol succinate (TOPROL-XL) 100 MG 24 hr  tablet, Take 100 mg by mouth daily., Disp: , Rfl:   Exam: Current vital signs: BP (!) 175/70   Pulse 74   Temp 98.1 F (36.7 C)   Resp 13   SpO2 97%  Vital signs in last 24 hours: Temp:  [98 F (36.7 C)-98.1 F (36.7 C)] 98.1 F (36.7 C) (08/29 1430) Pulse Rate:  [69-79] 74 (08/29 1515) Resp:  [12-20] 13 (08/29 1515) BP: (170-204)/(66-87) 175/70 (08/29 1515) SpO2:  [97 %-100 %] 97 % (08/29 1515) GENERAL: Awake, alert in NAD Ext: warm, well perfused, intact peripheral pulses,  CVS Resp: Abd:  NEURO:  Mental Status: AA&Ox3, speech is clear.  Naming, repetition, fluency, and comprehension intact. Cranial Nerves: PERRL 2 mm/brisk. EOMI, visual fields full, right facial droop at rest, facial sensation intact, hearing intact, tongue/uvula/soft palate midline, normal sternocleidomastoid and trapezius muscle strength.   Motor: 5 out of 5 throughout with the exception of her right arm which is 4/5 Tone: is normal and bulk is normal Sensation- Intact to light touch bilaterally Coordination: Right finger-nose showed some dysmetria secondary to strength otherwise left arm showed no dysmetria in bilateral legs showed no dysmetria or ataxia Gait- deferred  Labs I have reviewed labs in epic and the results pertinent to this consultation are:  CBC    Component Value Date/Time   WBC 9.8 01/02/2018 1220   RBC 4.44 01/02/2018 1220   HGB 13.6 01/02/2018 1236  HCT 40.0 01/02/2018 1236   PLT 306 01/02/2018 1220   MCV 91.2 01/02/2018 1220   MCH 30.4 01/02/2018 1220   MCHC 33.3 01/02/2018 1220   RDW 12.8 01/02/2018 1220   LYMPHSABS 2.5 01/02/2018 1220   MONOABS 0.8 01/02/2018 1220   EOSABS 0.0 01/02/2018 1220   BASOSABS 0.0 01/02/2018 1220  CMP     Component Value Date/Time   NA 139 01/02/2018 1236   K 3.9 01/02/2018 1236   CL 103 01/02/2018 1236   CO2 27 01/02/2018 1220   GLUCOSE 96 01/02/2018 1236   BUN 20 01/02/2018 1236   CREATININE 0.90 01/02/2018 1236   CALCIUM 9.4  01/02/2018 1220   PROT 7.1 01/02/2018 1220   ALBUMIN 3.8 01/02/2018 1220   AST 24 01/02/2018 1220   ALT 17 01/02/2018 1220   ALKPHOS 72 01/02/2018 1220   BILITOT 0.6 01/02/2018 1220   GFRNONAA >60 01/02/2018 1220   GFRAA >60 01/02/2018 1220    Lipid Panel  No results found for: CHOL, TRIG, HDL, CHOLHDL, VLDL, LDLCALC, LDLDIRECT   Imaging I have reviewed the images obtained:  CT-scan of the brain--Small age-indeterminate lacunar infarct in the left basal ganglia  MRI examination of the brain--pending  Assessment: 83 year old female with right arm weakness and CT showing small age-indeterminate lacunar infarct in the left basal ganglia  Recommendations: # MRI of the brain without contrast #MRA Head and neck  #Transthoracic Echo,   # Start patient on ASA 325mg  daily,   #Start  Atorvastatin 80 mg/other high intensity statin if LDL is greater than 70 # BP goal: Goal is 120/80 # HBAIC and Lipid profile # Telemetry monitoring # Frequent neuro checks # NPO until passes stroke swallow screen # please page stroke NP  Or  PA  Or MD from 8am -4 pm  as this patient from this time will be  followed by the stroke.   You can look them up on [email or web address]  Password [facility]  Attending addendum Patient seen and examined on the unit. Agree with the history and physical above in addition to changes made below. Agree with review of systems, which also was independently obtained by me.  MRI imaging and CT Angio of head and neck reviewed by me personally. MRI of the brain shows a 2.3 cm acute ischemic stroke in the posterior left basal ganglia/external capsule.  CTA head and neck does not show any large vessel occlusion.  Relatively minor atherosclerotic changes in the arterial vasculature in the neck. Atheromatous changes in the bilateral PCAs with short segment moderate proximal right P2 stenosis otherwise negative intracranial CTA. Incidental finding of 4 mm left lower lobe pulmonary nodule  indeterminate.  Follow-up by primary in 12 months if patient is high risk. 15 mm precarinal lymph node per report-nonspecific but most likely reactive.  Patient confirmed the history above.  Symptoms ongoing since Sunday.  On examination she is awake alert oriented x3.  Her speech is mildly dysarthric.  Naming, attention repetition intact.  Pupils are equal round reactive to light, extra ocular movements intact, visual fields are full, there is subtle right lower facial weakness and droop, auditory acuity intact, palate elevates symmetrically, shoulder shrug intact and tongue midline.  4/5 strength on the right upper extremity with mild vertical drift.  5/5 strength in the left upper extremity with no drift.  Nearly symmetric antigravity 5/5 lower extremities without vertical drift and either lower extremity.  Intact sensation all over with no extinction.  Coordination exam showed intact finger-nose-finger  bilaterally.  Gait testing was deferred at this time  Imaging independently reviewed as above  Assessment and plan-agree with the assessment and plan documented above. Most likely small vessel etiology stroke in the left basal ganglia.  Needs to be evaluated by cardiac monitoring and echo cardio gram for an embolic source. She might be a candidate for dual antiplatelet therapy going forward- aspirin 325 for now. For now recommendations as above. Stroke team to follow.  -- Amie Portland, MD [facility] Pager: [phone] If 7pm to 7am, please call on call as listed on AMION.

## 2018-01-02 NOTE — Progress Notes (Signed)
Patient arrived on unit in stable condition with husband joking around. Vitals taken and placed in bed. Right arm and weakness noted, as well as a right facial droop.

## 2018-01-02 NOTE — ED Provider Notes (Signed)
[facility] Emergency Department Provider Note MRN:  [id number]  Arrival date & time: 01/02/18     Chief Complaint   Cerebrovascular Accident   History of Present Illness   Ashley Phillips is a 83 y.o. year-old female with a history of hypertension presenting to the ED with chief complaint of right-sided weakness.  Patient explains that she woke up 4 days ago with mild numbness and weakness to the right arm.  Very subtle weakness, trouble with handwriting, consistently dropping objects that she was holding in the right hand.  The next day, patient noticed a very subtle facial droop on the right side.  Denies slurred speech, no trouble swallowing, no headache, no vision change.  And yesterday, patient noticed subtle weakness to the right leg, only minorly affecting her ambulation.  No recent fevers, no chest pain, no shortness of breath, no abdominal pain, no dysuria.  No prior history of stroke.  Review of Systems  A complete 10 system review of systems was obtained and all systems are negative except as noted in the HPI and PMH.   Patient's Health History    Past Medical History:  Diagnosis Date  . Hypertension     History reviewed. No pertinent surgical history.  Family History  Problem Relation Age of Onset  . Hypertension Mother   . Hypertension Father     Social History   Socioeconomic History  . Marital status: Married    Spouse name: Not on file  . Number of children: Not on file  . Years of education: Not on file  . Highest education level: Not on file  Occupational History  . Not on file  Social Needs  . Financial resource strain: Not on file  . Food insecurity:    Worry: Not on file    Inability: Not on file  . Transportation needs:    Medical: Not on file    Non-medical: Not on file  Tobacco Use  . Smoking status: Never Smoker  . Smokeless tobacco: Never Used  Substance and Sexual Activity  . Alcohol use: Not on file  . Drug use: Not on  file  . Sexual activity: Not on file  Lifestyle  . Physical activity:    Days per week: Not on file    Minutes per session: Not on file  . Stress: Not on file  Relationships  . Social connections:    Talks on phone: Not on file    Gets together: Not on file    Attends religious service: Not on file    Active member of club or organization: Not on file    Attends meetings of clubs or organizations: Not on file    Relationship status: Not on file  . Intimate partner violence:    Fear of current or ex partner: Not on file    Emotionally abused: Not on file    Physically abused: Not on file    Forced sexual activity: Not on file  Other Topics Concern  . Not on file  Social History Narrative  . Not on file     Physical Exam  Vital Signs and Nursing Notes reviewed Vitals:   01/02/18 1800 01/02/18 1843  BP: (!) 165/62 (!) 180/72  Pulse: 62 73  Resp: 15 18  Temp:  98 F (36.7 C)  SpO2: 96% 100%    CONSTITUTIONAL: Well-appearing, NAD NEURO:  Alert and oriented x 3, subtle right facial droop with sparing of the forehead EYES:  eyes equal and reactive ENT/NECK:  no LAD, no JVD CARDIO: Regular rate, well-perfused, normal S1 and S2 PULM:  CTAB no wheezing or rhonchi GI/GU:  normal bowel sounds, non-distended, non-tender MSK/SPINE:  No gross deformities, no edema SKIN:  no rash, atraumatic PSYCH:  Appropriate speech and behavior  Diagnostic and Interventional Summary    EKG Interpretation  Date/Time:    Ventricular Rate:    PR Interval:    QRS Duration:   QT Interval:    QTC Calculation:   R Axis:     Text Interpretation:        Labs Reviewed  PROTIME-INR  APTT  CBC  DIFFERENTIAL  COMPREHENSIVE METABOLIC PANEL  HEMOGLOBIN A1C  LIPID PANEL  CBC  CREATININE, SERUM  I-STAT TROPONIN, ED  CBG MONITORING, ED  I-STAT CHEM 8, ED    CT HEAD WO CONTRAST  Final Result    MR BRAIN WO CONTRAST    (Results Pending)  CT ANGIO NECK W OR WO CONTRAST    (Results  Pending)  CT ANGIO HEAD W OR WO CONTRAST    (Results Pending)    Medications  metoprolol succinate (TOPROL-XL) 24 hr tablet 100 mg (has no administration in time range)  Calcium Carb-Cholecalciferol 500-600 MG-UNIT TABS 1 tablet (has no administration in time range)  cholecalciferol (VITAMIN D) tablet 1,000 Units (has no administration in time range)  loratadine (CLARITIN) tablet 10 mg (has no administration in time range)   stroke: mapping our early stages of recovery book (has no administration in time range)  acetaminophen (TYLENOL) tablet 650 mg (has no administration in time range)    Or  acetaminophen (TYLENOL) solution 650 mg (has no administration in time range)    Or  acetaminophen (TYLENOL) suppository 650 mg (has no administration in time range)  senna-docusate (Senokot-S) tablet 1 tablet (has no administration in time range)  enoxaparin (LOVENOX) injection 40 mg (has no administration in time range)  aspirin suppository 300 mg (has no administration in time range)    Or  aspirin tablet 325 mg (has no administration in time range)  iopamidol (ISOVUE-370) 76 % injection (has no administration in time range)     Procedures Critical Care  ED Course and Medical Decision Making  I have reviewed the triage vital signs and the nursing notes.  Pertinent labs & imaging results that were available during my care of the patient were reviewed by me and considered in my medical decision making (see below for details). Clinical Course as of Jan 02 1850  Thu Jan 02, 2018  1302 Concern for completed stroke in this 83 year old female with subtle but present right-sided weakness.  CT head pending, if needed we will follow-up with MRI and/or neurology consultation.   [MB]  1025 CT reveals lacunar infarct of the left basal ganglia, will consult neurology for admission given concern for new stroke.   [MB]    Clinical Course User Index [MB] Maudie Flakes, MD    Evaluated by neurology,  admitted to internal medicine for further care and evaluation.  Barth Kirks. Sedonia Small, [place] [email or web address]  Final Clinical Impressions(s) / ED Diagnoses     ICD-10-CM   1. Lacunar infarct, acute ([place]) I63.81   2. Cerebrovascular accident (CVA), unspecified mechanism [facility]) I63.9     ED Discharge Orders    None         Maudie Flakes, MD 01/02/18 [phone]

## 2018-01-03 ENCOUNTER — Inpatient Hospital Stay (HOSPITAL_COMMUNITY): Payer: Medicare Other

## 2018-01-03 DIAGNOSIS — I340 Nonrheumatic mitral (valve) insufficiency: Secondary | ICD-10-CM

## 2018-01-03 DIAGNOSIS — Z79899 Other long term (current) drug therapy: Secondary | ICD-10-CM

## 2018-01-03 DIAGNOSIS — I10 Essential (primary) hypertension: Secondary | ICD-10-CM

## 2018-01-03 DIAGNOSIS — I63312 Cerebral infarction due to thrombosis of left middle cerebral artery: Secondary | ICD-10-CM

## 2018-01-03 DIAGNOSIS — E785 Hyperlipidemia, unspecified: Secondary | ICD-10-CM

## 2018-01-03 DIAGNOSIS — I6381 Other cerebral infarction due to occlusion or stenosis of small artery: Secondary | ICD-10-CM

## 2018-01-03 LAB — LIPID PANEL
CHOLESTEROL: 239 mg/dL — AB (ref 0–200)
HDL: 64 mg/dL (ref >40)
LDL Cholesterol: 140 mg/dL — ABNORMAL HIGH (ref 0–99)
Total CHOL/HDL Ratio: 3.7 RATIO
Triglycerides: 176 mg/dL — ABNORMAL HIGH (ref <150)
VLDL: 35 mg/dL (ref 0–40)

## 2018-01-03 LAB — ECHOCARDIOGRAM COMPLETE
HEIGHTINCHES: 68 in
WEIGHTICAEL: 2576 [oz_av]

## 2018-01-03 LAB — HEMOGLOBIN A1C
HEMOGLOBIN A1C: 5.5 % (ref 4.8–5.6)
MEAN PLASMA GLUCOSE: 111.15 mg/dL

## 2018-01-03 MED ORDER — ASPIRIN EC 81 MG PO TBEC
81.0000 mg | DELAYED_RELEASE_TABLET | Freq: Every day | ORAL | Status: DC
  Administered 2018-01-03: 81 mg via ORAL
  Filled 2018-01-03: qty 1

## 2018-01-03 MED ORDER — ASPIRIN 81 MG PO TBEC: 81.0000 mg | DELAYED_RELEASE_TABLET | Freq: Every day | ORAL | 0 refills | Status: DC

## 2018-01-03 MED ORDER — CLOPIDOGREL BISULFATE 75 MG PO TABS
75.0000 mg | ORAL_TABLET | Freq: Every day | ORAL | Status: DC
  Administered 2018-01-03: 75 mg via ORAL
  Filled 2018-01-03: qty 1

## 2018-01-03 MED ORDER — ATORVASTATIN CALCIUM 10 MG PO TABS: 20.0000 mg | ORAL_TABLET | Freq: Every day | ORAL | Status: DC

## 2018-01-03 MED ORDER — CLOPIDOGREL BISULFATE 75 MG PO TABS: 75.0000 mg | ORAL_TABLET | Freq: Every day | ORAL | 0 refills | Status: DC

## 2018-01-03 MED ORDER — ATORVASTATIN CALCIUM 20 MG PO TABS: 20.0000 mg | ORAL_TABLET | Freq: Every day | ORAL | 0 refills | Status: AC

## 2018-01-03 MED ORDER — ASPIRIN EC 325 MG PO TBEC: 325.0000 mg | DELAYED_RELEASE_TABLET | Freq: Every day | ORAL | 0 refills | Status: AC

## 2018-01-03 NOTE — Care Management Note (Signed)
Case Management Note  Patient Details  Name: Ashley Phillips MRN: [id number] Date of Birth: 1934/06/26  Subjective/Objective:    Pt admitted with CVA. She is from home with her spouse. Pt has no DME and no issues with transportation or obtaining her medications.                Action/Plan: CM consulted for outpatient therapy. CM met with patient and she would like to attend at [facility]. CM will fax [facility] the needed information. Pt states her spouse can provide supervision at home and transportation to home.   Expected Discharge Date:                  Expected Discharge Plan:  OP Rehab  In-House Referral:     Discharge planning Services  CM Consult  Post Acute Care Choice:    Choice offered to:     DME Arranged:    DME Agency:     HH Arranged:    [place] Agency:     Status of Service:  Completed, signed off  If discussed at [employer] of Stay Meetings, dates discussed:    Additional Comments:  Pollie Friar, RN 01/03/2018, 1:03 PM

## 2018-01-03 NOTE — Progress Notes (Signed)
  Echocardiogram 2D Echocardiogram has been performed.  Ashley Phillips 01/03/2018, 9:51 AM

## 2018-01-03 NOTE — Progress Notes (Signed)
SLP Cancellation Note  Patient Details Name: Ashley Phillips MRN: [id number] DOB: 1934-06-27   Cancelled treatment:       Reason Eval/Treat Not Completed: SLP screened, no needs identified, will sign off.  Pt passed stroke swallow screen and has been tolerating diet without difficulty per RN and pt.  No overt cognitive-linguistic concerns endorsed by PT/OT/RN or pt.  During conversation with therapist, pt had trace right facial droop which did not impact speech intelligibility at all.  Pt also denies any word finding difficulty or other language deficits.  As a result, formal evaluation was deferred as pt is imminently awaiting discharge and appears to be back to baseline for speech, language, cognition, and swallowing.  Discussed with pt that should she experience any difficulty at home in the abovementioned areas that she can reach out to her medical team for an OP ST consult.  All questions were answered to her satisfaction at this time.     Shaylee Stanislawski, Selinda Orion 01/03/2018, 2:24 PM

## 2018-01-03 NOTE — Progress Notes (Signed)
Pt discharge education and instructions completed with pt and spouse at bedside; both voices understanding and denies any questions. Pt IV and telemetry removed; pt discharge home with spouse to transport her home. Pt to pick up electronically sent prescriptions from preferred pharmacy on file. Pt transported off unit via wheelchair with belongings and spouse to the side by volunteer. Delia Heady RN

## 2018-01-03 NOTE — Progress Notes (Signed)
STROKE TEAM PROGRESS NOTE   SUBJECTIVE (INTERVAL HISTORY) No family is at the bedside.  She still has mild right hand weakness but walked well with PT in the hallway. She admitted that she still takes estrogen but probably OK without it. I recommended her to discuss with her GYN.    OBJECTIVE Vitals:   01/03/18 0223 01/03/18 0423 01/03/18 0812 01/03/18 0845  BP: (!) 123/52 (!) 135/49 (!) 159/72 (!) 145/62  Pulse: 68 68 82 81  Resp:  14 11 18   Temp:  (!) 97.4 F (36.3 C) 98.7 F (37.1 C)   TempSrc:  Oral Oral   SpO2: 100% 99% 98% 96%  Weight:      Height:        CBC:  Recent Labs  Lab 01/02/18 1220 01/02/18 1236  WBC 9.8  --   NEUTROABS 6.5  --   HGB 13.5 13.6  HCT 40.5 40.0  MCV 91.2  --   PLT 306  --     Basic Metabolic Panel:  Recent Labs  Lab 01/02/18 1220 01/02/18 1236  NA 140 139  K 3.7 3.9  CL 104 103  CO2 27  --   GLUCOSE 99 96  BUN 14 20  CREATININE 0.87 0.90  CALCIUM 9.4  --     Lipid Panel:     Component Value Date/Time   CHOL 239 (H) 01/03/2018 0439   TRIG 176 (H) 01/03/2018 0439   HDL 64 01/03/2018 0439   CHOLHDL 3.7 01/03/2018 0439   VLDL 35 01/03/2018 0439   LDLCALC 140 (H) 01/03/2018 0439   HgbA1c:  Lab Results  Component Value Date   HGBA1C 5.5 01/03/2018   Urine Drug Screen: No results found for: LABOPIA, COCAINSCRNUR, LABBENZ, AMPHETMU, THCU, LABBARB  Alcohol Level No results found for: ETH  IMAGING  Ct Angio Head W Or Wo Contrast Ct Angio Neck W Or Wo Contrast 01/02/2018 IMPRESSION:  1. Negative CTA for large vessel occlusion.  2. Relatively minor atherosclerotic change involving the major arterial vasculature of the neck for patient age. No hemodynamically significant stenosis.  3. Atheromatous change involving the bilateral PCAs, with single short-segment moderate proximal right P2 stenosis. Otherwise negative intracranial CTA. No other hemodynamically significant or correctable stenosis.  4. 4 mm left lower lobe  pulmonary nodule, indeterminate. No follow-up needed if patient is low-risk. Non-contrast chest CT can be considered in 12 months if patient is high-risk. This recommendation follows the consensus statement: Guidelines for Management of Incidental Pulmonary Nodules Detected on CT Images: From the Fleischner Society 2017; Radiology 2017; 284:228-243.  5. 15 mm precarinal lymph node, nonspecific, but most commonly reactive.   Ct Head Wo Contrast 01/02/2018 IMPRESSION:  1. Small age-indeterminate lacunar infarct in the left basal ganglia.   Mr Brain Wo Contrast 01/02/2018 IMPRESSION:  1. 2.3 cm acute ischemic nonhemorrhagic perforator type infarct involving the posterior left basal ganglia.  2. Mild chronic microvascular ischemic disease.   TTE - Left ventricle: The cavity size was normal. There was mild focal   basal hypertrophy of the septum. Systolic function was normal.   The estimated ejection fraction was in the range of 50% to 55%.   Wall motion was normal; there were no regional wall motion   abnormalities. Features are consistent with a pseudonormal left   ventricular filling pattern, with concomitant abnormal relaxation   and increased filling pressure (grade 2 diastolic dysfunction). - Aortic valve: There was moderate regurgitation. Regurgitation   pressure half-time: 348 ms. - Mitral  valve: There was mild regurgitation. - Right ventricle: Systolic function was normal. - Atrial septum: No defect or patent foramen ovale was identified. - Tricuspid valve: There was trivial regurgitation. - Pulmonic valve: There was trivial regurgitation. - Pulmonary arteries: Systolic pressure was mildly increased. PA   peak pressure: 32 mm Hg (S).   PHYSICAL EXAM  Temp:  [97.4 F (36.3 C)-98.7 F (37.1 C)] 98.7 F (37.1 C) (08/30 0812) Pulse Rate:  [61-82] 81 (08/30 0845) Resp:  [10-21] 18 (08/30 0845) BP: (123-181)/(49-72) 145/62 (08/30 0845) SpO2:  [96 %-100 %] 96 % (08/30  0845) Weight:  [73 kg] 73 kg (08/29 1843)  General - Well nourished, well developed, in no apparent distress.  Ophthalmologic - fundi not visualized due to noncooperation.  Cardiovascular - Regular rate and rhythm with no murmur.  Mental Status -  Level of arousal and orientation to time, place, and person were intact. Language including expression, naming, repetition, comprehension was assessed and found intact. Attention span and concentration were normal. Fund of Knowledge was assessed and was intact.  Cranial Nerves II - XII - II - Visual field intact OU. III, IV, VI - Extraocular movements intact. V - Facial sensation intact bilaterally. VII - subtle right nasolabial fold flattening. VIII - Hearing & vestibular intact bilaterally. X - Palate elevates symmetrically. XI - Chin turning & shoulder shrug intact bilaterally. XII - Tongue protrusion intact.  Motor Strength - The patient's strength was normal in all extremities except right hand dexterity difficulty and pronator drift was absent.  Bulk was normal and fasciculations were absent.   Motor Tone - Muscle tone was assessed at the neck and appendages and was normal.  Reflexes - The patient's reflexes were symmetrical in all extremities and she had no pathological reflexes.  Sensory - Light touch, temperature/pinprick were assessed and were symmetrical.    Coordination - The patient had normal movements in the hands with no ataxia or dysmetria.  Tremor was absent.  Gait and Station - deferred.   ASSESSMENT/PLAN Ashley Phillips is a 83 y.o. female with history of hypertension presenting with Rt arm weakness. She did not receive IV t-PA due to late presentation.   Stroke:  left BG/CR infarct likely small vessel disease.  Resultant  Left hand mild dexterity difficulty  CT head - Small age-indeterminate lacunar infarct in the left basal ganglia.   MRI head - 2.3 cm acute ischemic nonhemorrhagic perforator type  infarct involving the posterior left basal ganglia.   CTA H&N -  Negative CTA for large vessel occlusion.  Right P2 proximal short segment moderate stenosis  2D Echo - EF 50 to 55%  LDL - 140  HgbA1c - 5.5  VTE prophylaxis - Lovenox  Diet  - Heart healthy with thin liquids.  No antithrombotic prior to admission, now on aspirin 81 mg daily and clopidogrel 75 mg daily.  Continue DAPT for 3 weeks and then aspirin alone.  Patient counseled to be compliant with her antithrombotic medications  Ongoing aggressive stroke risk factor management  Therapy recommendations:  pending  Disposition:  Pending  Hypertension  Stable . Permissive hypertension (OK if < 220/120) but gradually normalize in 5-7 days . Long-term BP goal normotensive  Hyperlipidemia  Lipid lowering medication PTA:  none  LDL 140, goal < 70  Current lipid lowering medication: Lipitor 20 mg daily  Continue statin at discharge  Other Stroke Risk Factors  Advanced age  Estrogen use - recommend patient to discuss with her GYN  doctor to see if estrogen can be discontinued.  Other Active Problems  4 mm left lower lobe pulmonary nodule, indeterminate.  Hospital day # 1  Neurology will sign off. Please call with questions. Pt will follow up with stroke clinic NP at [facility] in about 4 weeks. Thanks for the consult.   Rosalin Hawking, MD PhD Stroke Neurology 01/03/2018 2:24 PM    To contact Stroke Continuity provider, please refer to [email or web address] After hours, contact General Neurology

## 2018-01-03 NOTE — Evaluation (Signed)
Physical Therapy Evaluation Patient Details Name: Ashley Phillips MRN: [id number] DOB: 1935/02/22 Today's Date: 01/03/2018   History of Present Illness  Patient is a 83 y/o female who presents with right sided weakness, facial droop. NIH:1. Brain MRI-infarct in left basal ganglia and external capsule. PMH includes HTN.  Clinical Impression  Patient presents with mild right sided weakness (RUE>RLE), incoordination RUE and impaired balance s/p above. Tolerated gait training with Min guard for safety. Pt with mild staggering but no overt LOB- reports some of this is premorbid. Pt lives with spouse and independent PTA. Education re: signs/symptoms of stroke. W2.ill follow acutely to maximize independence and mobility prior to return home.      No PT follow up;Supervision - Intermittent    Equipment Recommendations  None recommended by PT    Recommendations for Other Services       Precautions / Restrictions Precautions Precautions: Fall Restrictions Weight Bearing Restrictions: No      Mobility  Bed Mobility Overal bed mobility: Modified Independent             General bed mobility comments: No assist needed, HOB elevated.   Transfers Overall transfer level: Needs assistance Equipment used: None Transfers: Sit to/from Stand Sit to Stand: Min guard         General transfer comment: Min guard for safety. Stood from [employer], transferred to chair post ambulation.  Ambulation/Gait Ambulation/Gait assistance: Min guard Gait Distance (Feet): 300 Feet Assistive device: None Gait Pattern/deviations: Step-through pattern;Decreased stride length;Staggering left;Staggering right Gait velocity: decreased   General Gait Details: Slow mildly unsteady gait with staggering to right/left (which pt states is not abnormal) but no overt LOB.  Stairs Stairs: Yes Stairs assistance: Supervision Stair Management: One rail Left;Alternating pattern Number of Stairs: 3(+ 2 steps x2  bouts) General stair comments: Cues for technique.  Wheelchair Mobility    Modified Rankin (Stroke Patients Only) Modified Rankin (Stroke Patients Only) Pre-Morbid Rankin Score: Slight disability Modified Rankin: Slight disability     Balance Overall balance assessment: Needs assistance Sitting-balance support: Feet supported;No upper extremity supported Sitting balance-Leahy Scale: Good     Standing balance support: During functional activity Standing balance-Leahy Scale: Good                               Pertinent Vitals/Pain Pain Assessment: No/denies pain    Home Living Family/patient expects to be discharged to:: Private residence Living Arrangements: Spouse/significant other Available Help at Discharge: Family;Available 24 hours/day Type of Home: House Home Access: Stairs to enter Entrance Stairs-Rails: Psychiatric nurse of Steps: 1 to enter, stairs to basement Home Layout: Laundry or work area in [employer]: None      Prior Function Level of Independence: Independent         Comments: Drives. Likes to cook/bake. LIkes word puzzles.      Hand Dominance   Dominant Hand: Right    Extremity/Trunk Assessment   Upper Extremity Assessment Upper Extremity Assessment: Defer to OT evaluation;RUE deficits/detail RUE Deficits / Details: IMpaired coordination- finger to nose and opposition. Dysmetria noted. RUE Coordination: decreased fine motor    Lower Extremity Assessment Lower Extremity Assessment: RLE deficits/detail RLE Deficits / Details: Grossly ~4/5 throughout RLE Sensation: WNL RLE Coordination: WNL    Cervical / Trunk Assessment Cervical / Trunk Assessment: Kyphotic  Communication   Communication: No difficulties  Cognition Arousal/Alertness: Awake/alert Behavior During Therapy: WFL for tasks assessed/performed Overall Cognitive Status:  Within Functional Limits for tasks assessed                                         General Comments      Exercises     Assessment/Plan    PT Assessment Patient needs continued PT services  PT Problem List Decreased strength;Decreased mobility;Decreased coordination;Decreased balance       PT Treatment Interventions Functional mobility training;Balance training;Patient/family education;Gait training;Therapeutic activities;Therapeutic exercise;Stair training;Neuromuscular re-education    PT Goals (Current goals can be found in the Care Plan section)  Acute Rehab PT Goals Patient Stated Goal: to be able to use my right hand like normal PT Goal Formulation: With patient Time For Goal Achievement: 01/17/18 Potential to Achieve Goals: Good    Frequency Min 4X/week   Barriers to discharge        Co-evaluation               AM-PAC PT "6 Clicks" Daily Activity  Outcome Measure Difficulty turning over in bed (including adjusting bedclothes, sheets and blankets)?: None Difficulty moving from lying on back to sitting on the side of the bed? : None Difficulty sitting down on and standing up from a chair with arms (e.g., wheelchair, bedside commode, etc,.)?: None Help needed moving to and from a bed to chair (including a wheelchair)?: None Help needed walking in hospital room?: A Little Help needed climbing 3-5 steps with a railing? : A Little 6 Click Score: 22    End of Session Equipment Utilized During Treatment: Gait belt Activity Tolerance: Patient tolerated treatment well Patient left: in chair;with call bell/phone within reach Nurse Communication: Mobility status PT Visit Diagnosis: Unsteadiness on feet (R26.81);Difficulty in walking, not elsewhere classified (R26.2)    Time: [phone] PT Time Calculation (min) (ACUTE ONLY): 16 min   Charges:   PT Evaluation $PT Eval Low Complexity: 1 Low          Wray Kearns, PT, DPT Acute Rehabilitation Services Pager [phone] Office  [place] 01/03/2018, 9:40 AM

## 2018-01-03 NOTE — Evaluation (Signed)
Occupational Therapy Evaluation Patient Details Name: Ashley Phillips MRN: [id number] DOB: 03/17/1935 Today's Date: 01/03/2018    History of Present Illness Patient is a 83 y/o female who presents with right sided weakness, facial droop. NIH:1. Brain MRI-infarct in left basal ganglia and external capsule. PMH includes HTN.   Clinical Impression   PTA Pt independent in ADL/IADL and mobility. Enjoys cooking, word puzzles - however does have an on/off again tremor in her RUE/hand PTA. Pt is currently demonstrating deficits in RUE/hand with decreased coordination and slight dysmetria. Pt is functional for sink level grooming at min guard level without DME - but she states "it's just not normal yet". At this time provided (and practiced) fine motor coordination handout and theraputty exercise protocol. OT will follow during acute stay and at this time recommend OPOT to address RUE deficits Next session focus on tub transfer/?need for shower chair?.     Follow Up Recommendations  Outpatient OT;Supervision - Intermittent    Equipment Recommendations  None recommended by OT    Recommendations for Other Services       Precautions / Restrictions Precautions Precautions: Fall Restrictions Weight Bearing Restrictions: No      Mobility Bed Mobility Overal bed mobility: Modified Independent             General bed mobility comments: Pt OOB in recliner when OT entered room  Transfers Overall transfer level: Needs assistance Equipment used: None Transfers: Sit to/from Stand Sit to Stand: Min guard         General transfer comment: Min guard for safety. Stood from [employer], transferred to chair post ambulation.    Balance Overall balance assessment: Needs assistance Sitting-balance support: Feet supported;No upper extremity supported Sitting balance-Leahy Scale: Good Sitting balance - Comments: ableto access LB without LOB   Standing balance support: During functional  activity Standing balance-Leahy Scale: Good                             ADL either performed or assessed with clinical judgement   ADL Overall ADL's : Needs assistance/impaired Eating/Feeding: Modified independent   Grooming: Oral care;Wash/dry hands;Min guard;Standing Grooming Details (indicate cue type and reason): sink level Upper Body Bathing: Min guard   Lower Body Bathing: Min guard   Upper Body Dressing : Modified independent   Lower Body Dressing: Min guard Lower Body Dressing Details (indicate cue type and reason): able to don socks without assist, min guard for sit <>stand Toilet Transfer: Min guard;Ambulation   Toileting- Clothing Manipulation and Hygiene: Supervision/safety       Functional mobility during ADLs: Min guard General ADL Comments: educated on building up handles to ease the strain of fine motor if she notices fatigue in her hand     Vision Baseline Vision/History: Wears glasses Wears Glasses: At all times Patient Visual Report: No change from baseline       Perception     Praxis      Pertinent Vitals/Pain Pain Assessment: No/denies pain     Hand Dominance Right   Extremity/Trunk Assessment Upper Extremity Assessment Upper Extremity Assessment: RUE deficits/detail RUE Deficits / Details: slight dysmetria noted in finger to nose with RUE, strength slightly less than left RUE Sensation: WNL RUE Coordination: decreased fine motor   Lower Extremity Assessment Lower Extremity Assessment: Defer to PT evaluation RLE Deficits / Details: Grossly ~4/5 throughout RLE Sensation: WNL RLE Coordination: WNL   Cervical / Trunk Assessment Cervical / Trunk  Assessment: Kyphotic   Communication Communication Communication: No difficulties   Cognition Arousal/Alertness: Awake/alert Behavior During Therapy: WFL for tasks assessed/performed Overall Cognitive Status: Within Functional Limits for tasks assessed                                      General Comments       Exercises Exercises: Other exercises Other Exercises Other Exercises: Thareputty handout provided and practiced in full - teach back Other Exercises: fine motor coordination handout provided and reviewed in full   Shoulder Instructions      Home Living Family/patient expects to be discharged to:: Private residence Living Arrangements: Spouse/significant other Available Help at Discharge: Family;Available 24 hours/day Type of Home: House Home Access: Stairs to enter [employer] of Steps: 1 to enter, stairs to basement Entrance Stairs-Rails: Right;Left Home Layout: Laundry or work area in basement     [employer] Shower/Tub: Teacher, early years/pre: Standard     Home Equipment: None          Prior Functioning/Environment Level of Independence: Independent        Comments: Drives. Likes to cook/bake. LIkes word puzzles.         OT Problem List: Decreased strength;Decreased coordination;Impaired UE functional use      OT Treatment/Interventions: Therapeutic exercise;Neuromuscular education;Therapeutic activities    OT Goals(Current goals can be found in the care plan section) Acute Rehab OT Goals Patient Stated Goal: to be able to use my right hand like normal OT Goal Formulation: With patient Time For Goal Achievement: 01/17/18 Potential to Achieve Goals: Good ADL Goals Pt Will Perform Grooming: with modified independence;standing Pt Will Perform Tub/Shower Transfer: Tub transfer;with supervision;ambulating Additional ADL Goal #1: Pt will demonstrate theraputty exercise regiment at independent level for R hand  OT Frequency: Min 2X/week   Barriers to D/C:            Co-evaluation              AM-PAC PT "6 Clicks" Daily Activity     Outcome Measure Help from another person eating meals?: None Help from another person taking care of personal grooming?: None Help from another person  toileting, which includes using toliet, bedpan, or urinal?: None Help from another person bathing (including washing, rinsing, drying)?: A Little Help from another person to put on and taking off regular upper body clothing?: None Help from another person to put on and taking off regular lower body clothing?: None 6 Click Score: 23   End of Session Equipment Utilized During Treatment: Gait belt Nurse Communication: Mobility status  Activity Tolerance: Patient tolerated treatment well Patient left: in chair;with call bell/phone within reach  OT Visit Diagnosis: Unsteadiness on feet (R26.81);Hemiplegia and hemiparesis Hemiplegia - Right/Left: Right Hemiplegia - dominant/non-dominant: Dominant Hemiplegia - caused by: Cerebral infarction                Time: 5427-0623 OT Time Calculation (min): 31 min Charges:  OT General Charges $OT Visit: 1 Visit OT Evaluation $OT Eval Moderate Complexity: 1 Mod OT Treatments $Therapeutic Exercise: 8-22 mins  Hulda Humphrey OTR/L Acute Rehabilitation Services Pager: [phone] Office: [place] 01/03/2018, 10:56 AM

## 2018-01-10 NOTE — Discharge Summary (Addendum)
Physician Discharge Summary  Ashley Phillips ATF:[id number] DOB: 1934-10-22 DOA: 01/02/2018  PCP: Lavone Orn, MD  Admit date: 01/02/2018 Discharge date: 01/03/2018  Admitted From:Home.  Disposition:  Home  Recommendations for Outpatient Follow-up:  1. Follow up with PCP in 1-2 weeks 2. Please obtain BMP/CBC in one week Please follow up with neurology as recommended.   Discharge Condition:STABLE.  CODE STATUS:full code.  Diet recommendation: Heart Healthy  Brief/Interim Summary: Ashley Phillips a 83 y.o.femalewith history h/ohypertensionwho is on estrogen supplementation since her hysterectomy 35 years back, presents nowwith c/o right sided weakness. Patient states, at baseline, she has some essential tremors in her right hand due to which she has difficulty holding things sometimes. She woke up Sunday morning feeling heavy and weak in her right upper extremity/forearm which she related to possibly sleeping on that side. She noticed that she had to use her left hand to move/coordinate her right hand to her face while make-up. She figured that the symptoms would resolve over time.She however noticed some right lower extremity weakness as well this morning which concerned her and brought her to the ED.  Discharge Diagnoses:  Active Problems:   CVA (cerebral vascular accident) ([place])  Acute Ischemic CVA: CT head shows small age-indeterminate lacunar infarct in left basal ganglia.Patient is aspirin nave,she was started on aspirin 81 mg daily and plavix 75 mg daily for 3 weeks, followed by aspirin 325 mg daily as per neurology. Further work up with CT angio of the head and neck and MRi done. She improved and got back to her baseline in less than 24 hours.  Therapy evaluations done, recommended outpatient follow up with OT.  ECHO reviewed and did not show any thrombus.  hgbA1c is 5.5 LDL is140  Appreciate neurology recommendations.  Outpatient OT set up by CM.     Hypertension well controlled.    Advised to stop the estrogen supplementations.    Discharge Instructions  Discharge Instructions    Ambulatory referral to Neurology   Complete by:  As directed    Follow up with stroke clinic NP (Jessica Vanschaick or Cecille Rubin, if both not available, consider Zachery Dauer, or A[place]) at [facility] in about 4 weeks. Thanks.   Ambulatory referral to Occupational Therapy   Complete by:  As directed    Diet - low sodium heart healthy   Complete by:  As directed    Discharge instructions   Complete by:  As directed    Follow up with Neurology as recommended.  Please take aspirin 81 mg and plavix 75 mg daily for 3 weeks followed by aspirin 325 mg daily . Pleas follow up with PCP in one week.     Allergies as of 01/03/2018      Reactions   Lisinopril Swelling   Face was numb, lips was swollen.   Prednisone Swelling   Face turns red.      Medication List    STOP taking these medications   estradiol 1 MG tablet Commonly known as:  ESTRACE     TAKE these medications   aspirin 81 MG EC tablet Take 1 tablet (81 mg total) by mouth daily.   aspirin EC 325 MG tablet Take 1 tablet (325 mg total) by mouth daily. Start taking on:  01/25/2018   atorvastatin 20 MG tablet Commonly known as:  LIPITOR Take 1 tablet (20 mg total) by mouth daily at 6 PM.   cholecalciferol 1000 units tablet Commonly known as:  VITAMIN D Take 1,000  Units by mouth daily.   CLARITIN 10 MG tablet Generic drug:  loratadine Take 10 mg by mouth daily as needed for allergies.   clopidogrel 75 MG tablet Commonly known as:  PLAVIX Take 1 tablet (75 mg total) by mouth daily.   fluticasone 50 MCG/ACT nasal spray Commonly known as:  FLONASE Place 2 sprays into both nostrils daily as needed for allergies or rhinitis.   metoprolol succinate 100 MG 24 hr tablet Commonly known as:  TOPROL-XL Take 100 mg by mouth daily.   OS-CAL EXTRA D3 500-600 MG-UNIT  Tabs Generic drug:  Calcium Carb-Cholecalciferol Take 1 tablet by mouth daily.      Follow-up Information    [facility] Follow up.   Why:  They will contact you for the first appointment Contact information: [facility]. Schedule an appointment as soon as possible for a visit in 4 week(s).   Specialty:  Radiology Contact information: [place] [phone]         Allergies  Allergen Reactions  . Lisinopril Swelling    Face was numb, lips was swollen.  . Prednisone Swelling    Face turns red.    Consultations: Neurology.  Procedures/Studies: Ct Angio Head W Or Wo Contrast  Result Date: 01/02/2018 CLINICAL DATA:  Initial evaluation for acute stroke. EXAM: CT ANGIOGRAPHY HEAD AND NECK TECHNIQUE: Multidetector CT imaging of the head and neck was performed using the standard protocol during bolus administration of intravenous contrast. Multiplanar CT image reconstructions and MIPs were obtained to evaluate the vascular anatomy. Carotid stenosis measurements (when applicable) are obtained utilizing NASCET criteria, using the distal internal carotid diameter as the denominator. CONTRAST:  41mL ISOVUE-370 IOPAMIDOL (ISOVUE-370) INJECTION 76% COMPARISON:  Prior CT from earlier the same day. FINDINGS: CTA NECK FINDINGS Aortic arch: Visualized aortic arch of normal caliber with normal branch pattern. Mild atheromatous plaque within the aortic arch itself. No hemodynamically significant stenosis about the origin of the great vessels. Visualized subclavian arteries widely patent. Right carotid system: Right common and internal carotid arteries widely patent without stenosis, dissection, or occlusion. Mild atheromatous irregularity about the right carotid bifurcation without stenosis. Left carotid system: Left common carotid artery patent from its origin to the bifurcation  without stenosis. Mild calcified plaque about the left bifurcation without hemodynamically significant stenosis. Left ICA patent from the bifurcation to the skull base without stenosis, dissection, or occlusion Vertebral arteries: Both of the vertebral arteries arise from the subclavian arteries. Right vertebral artery dominant. Vertebral arteries widely patent within the neck without stenosis, dissection, or occlusion. Skeleton: No acute osseus abnormality. No worrisome lytic or blastic osseous lesions. Moderate cervical spondylolysis at C6-7. Other neck: No acute soft tissue abnormality within the neck. Upper chest: 15 mm precarinal lymph node noted (series 7, image 169). Visualized upper chest and mediastinum otherwise within normal limits. Partially visualized lungs are grossly clear. 4 mm nodule present within the superior segment of the left lower lobe (series 7, image 158). Review of the MIP images confirms the above findings CTA HEAD FINDINGS Anterior circulation: Petrous, cavernous, and supraclinoid segments widely patent bilaterally without stenosis. A1 segments, anterior communicating artery common anterior cerebral arteries widely patent to their distal aspects without stenosis. M1 segments patent bilaterally without stenosis. Normal MCA bifurcations. No proximal M2 occlusion. Distal MCA branches well perfused and symmetric. Posterior circulation: Vertebral arteries widely patent to the vertebrobasilar junction. Right PICA patent. Left  PICA not visualized. Dominant left AICA. Basilar artery widely patent to its distal aspect. Superior cerebral arteries patent bilaterally. Both of the PCAs primarily supplied via the basilar. Mild atheromatous irregularity within the mid-distal left P2 segment without high-grade stenosis. Focal moderate proximal right P2 stenosis (series 10, image 124). Right PCA otherwise widely patent to its distal aspect. Venous sinuses: Patent. Anatomic variants: None significant.  Delayed phase: No abnormal enhancement. Posterior fossa incompletely visualized. Review of the MIP images confirms the above findings IMPRESSION: 1. Negative CTA for large vessel occlusion. 2. Relatively minor atherosclerotic change involving the major arterial vasculature of the neck for patient age. No hemodynamically significant stenosis. 3. Atheromatous change involving the bilateral PCAs, with single short-segment moderate proximal right P2 stenosis. Otherwise negative intracranial CTA. No other hemodynamically significant or correctable stenosis. 4. 4 mm left lower lobe pulmonary nodule, indeterminate. No follow-up needed if patient is low-risk. Non-contrast chest CT can be considered in 12 months if patient is high-risk. This recommendation follows the consensus statement: Guidelines for Management of Incidental Pulmonary Nodules Detected on CT Images: From the Fleischner Society 2017; Radiology 2017; 284:228-243. 5. 15 mm precarinal lymph node, nonspecific, but most commonly reactive. Electronically Signed   By: Jeannine Boga M.D.   On: 01/02/2018 21:37   Ct Head Wo Contrast  Result Date: 01/02/2018 CLINICAL DATA:  Right arm and leg weakness with right facial droop. Symptoms began Sunday-Monday. EXAM: CT HEAD WITHOUT CONTRAST TECHNIQUE: Contiguous axial images were obtained from the base of the skull through the vertex without intravenous contrast. COMPARISON:  None. FINDINGS: Brain: Small lacunar infarct in the left basal ganglia. No hemorrhage, hydrocephalus, extra-axial collection or mass lesion/mass effect. Mild age related cerebral atrophy. Vascular: Atherosclerotic vascular calcification of the carotid siphons. No hyperdense vessel. Skull: Normal. Negative for fracture or focal lesion. Sinuses/Orbits: No acute finding. Other: None. IMPRESSION: 1. Small age-indeterminate lacunar infarct in the left basal ganglia. Electronically Signed   By: Titus Dubin M.D.   On: 01/02/2018 14:28   Ct  Angio Neck W Or Wo Contrast  Result Date: 01/02/2018 CLINICAL DATA:  Initial evaluation for acute stroke. EXAM: CT ANGIOGRAPHY HEAD AND NECK TECHNIQUE: Multidetector CT imaging of the head and neck was performed using the standard protocol during bolus administration of intravenous contrast. Multiplanar CT image reconstructions and MIPs were obtained to evaluate the vascular anatomy. Carotid stenosis measurements (when applicable) are obtained utilizing NASCET criteria, using the distal internal carotid diameter as the denominator. CONTRAST:  4mL ISOVUE-370 IOPAMIDOL (ISOVUE-370) INJECTION 76% COMPARISON:  Prior CT from earlier the same day. FINDINGS: CTA NECK FINDINGS Aortic arch: Visualized aortic arch of normal caliber with normal branch pattern. Mild atheromatous plaque within the aortic arch itself. No hemodynamically significant stenosis about the origin of the great vessels. Visualized subclavian arteries widely patent. Right carotid system: Right common and internal carotid arteries widely patent without stenosis, dissection, or occlusion. Mild atheromatous irregularity about the right carotid bifurcation without stenosis. Left carotid system: Left common carotid artery patent from its origin to the bifurcation without stenosis. Mild calcified plaque about the left bifurcation without hemodynamically significant stenosis. Left ICA patent from the bifurcation to the skull base without stenosis, dissection, or occlusion Vertebral arteries: Both of the vertebral arteries arise from the subclavian arteries. Right vertebral artery dominant. Vertebral arteries widely patent within the neck without stenosis, dissection, or occlusion. Skeleton: No acute osseus abnormality. No worrisome lytic or blastic osseous lesions. Moderate cervical spondylolysis at C6-7. Other neck: No acute soft tissue  abnormality within the neck. Upper chest: 15 mm precarinal lymph node noted (series 7, image 169). Visualized upper chest  and mediastinum otherwise within normal limits. Partially visualized lungs are grossly clear. 4 mm nodule present within the superior segment of the left lower lobe (series 7, image 158). Review of the MIP images confirms the above findings CTA HEAD FINDINGS Anterior circulation: Petrous, cavernous, and supraclinoid segments widely patent bilaterally without stenosis. A1 segments, anterior communicating artery common anterior cerebral arteries widely patent to their distal aspects without stenosis. M1 segments patent bilaterally without stenosis. Normal MCA bifurcations. No proximal M2 occlusion. Distal MCA branches well perfused and symmetric. Posterior circulation: Vertebral arteries widely patent to the vertebrobasilar junction. Right PICA patent. Left PICA not visualized. Dominant left AICA. Basilar artery widely patent to its distal aspect. Superior cerebral arteries patent bilaterally. Both of the PCAs primarily supplied via the basilar. Mild atheromatous irregularity within the mid-distal left P2 segment without high-grade stenosis. Focal moderate proximal right P2 stenosis (series 10, image 124). Right PCA otherwise widely patent to its distal aspect. Venous sinuses: Patent. Anatomic variants: None significant. Delayed phase: No abnormal enhancement. Posterior fossa incompletely visualized. Review of the MIP images confirms the above findings IMPRESSION: 1. Negative CTA for large vessel occlusion. 2. Relatively minor atherosclerotic change involving the major arterial vasculature of the neck for patient age. No hemodynamically significant stenosis. 3. Atheromatous change involving the bilateral PCAs, with single short-segment moderate proximal right P2 stenosis. Otherwise negative intracranial CTA. No other hemodynamically significant or correctable stenosis. 4. 4 mm left lower lobe pulmonary nodule, indeterminate. No follow-up needed if patient is low-risk. Non-contrast chest CT can be considered in 12  months if patient is high-risk. This recommendation follows the consensus statement: Guidelines for Management of Incidental Pulmonary Nodules Detected on CT Images: From the Fleischner Society 2017; Radiology 2017; 284:228-243. 5. 15 mm precarinal lymph node, nonspecific, but most commonly reactive. Electronically Signed   By: Jeannine Boga M.D.   On: 01/02/2018 21:37   Mr Brain Wo Contrast  Result Date: 01/02/2018 CLINICAL DATA:  Follow-up examination for acute stroke. EXAM: MRI HEAD WITHOUT CONTRAST TECHNIQUE: Multiplanar, multiecho pulse sequences of the brain and surrounding structures were obtained without intravenous contrast. COMPARISON:  Prior CT and CTA from earlier the same day. FINDINGS: Brain: Cerebral volume within normal limits for age. Scattered chronic microvascular ischemic changes present within the periventricular and deep white matter both cerebral hemispheres, mild for age. Approximate 2.3 cm curvilinear ischemic lacunar type infarcts seen extending from the posterior left lentiform nucleus into the posterior left caudate, consistent with an acute perforator type infarct (series 5, image 61). No associated hemorrhage or mass effect. No other evidence for acute or subacute ischemia. Gray-white matter differentiation otherwise maintained. No other areas of remote cortical infarction. No acute or chronic intracranial hemorrhage. No mass lesion, midline shift or mass effect. No hydrocephalus. No extra-axial fluid collection. Normal pituitary gland. Vascular: Major intracranial vascular flow voids maintained. Skull and upper cervical spine: Craniocervical junction normal. Bone marrow signal intensity normal. No scalp soft tissue abnormality. Sinuses/Orbits: Patient status post ocular lens replacement bilaterally. Paranasal sinuses are clear. Trace left mastoid effusion, of doubtful significance. Inner ear structures normal. Other: None. IMPRESSION: 1. 2.3 cm acute ischemic nonhemorrhagic  perforator type infarct involving the posterior left basal ganglia. 2. Mild chronic microvascular ischemic disease. Electronically Signed   By: Jeannine Boga M.D.   On: 01/02/2018 21:56    Echocardiogram.    Subjective: Back to baseline. No  chest pain or sob.   Discharge Exam: Vitals:   01/03/18 0812 01/03/18 0845  BP: (!) 159/72 (!) 145/62  Pulse: 82 81  Resp: 11 18  Temp: 98.7 F (37.1 C)   SpO2: 98% 96%   Vitals:   01/03/18 0223 01/03/18 0423 01/03/18 0812 01/03/18 0845  BP: (!) 123/52 (!) 135/49 (!) 159/72 (!) 145/62  Pulse: 68 68 82 81  Resp:  14 11 18   Temp:  (!) 97.4 F (36.3 C) 98.7 F (37.1 C)   TempSrc:  Oral Oral   SpO2: 100% 99% 98% 96%  Weight:      Height:        General: Pt is alert, awake, not in acute distress Cardiovascular: RRR, S1/S2 +, no rubs, no gallops Respiratory: CTA bilaterally, no wheezing, no rhonchi Abdominal: Soft, NT, ND, bowel sounds + Extremities: no edema, no cyanosis    The results of significant diagnostics from this hospitalization (including imaging, microbiology, ancillary and laboratory) are listed below for reference.     Microbiology: No results found for this or any previous visit (from the past 240 hour(s)).   Labs: BNP (last 3 results) No results for input(s): BNP in the last 8760 hours. Basic Metabolic Panel: No results for input(s): NA, K, CL, CO2, GLUCOSE, BUN, CREATININE, CALCIUM, MG, PHOS in the last 168 hours. Liver Function Tests: No results for input(s): AST, ALT, ALKPHOS, BILITOT, PROT, ALBUMIN in the last 168 hours. No results for input(s): LIPASE, AMYLASE in the last 168 hours. No results for input(s): AMMONIA in the last 168 hours. CBC: No results for input(s): WBC, NEUTROABS, HGB, HCT, MCV, PLT in the last 168 hours. Cardiac Enzymes: No results for input(s): CKTOTAL, CKMB, CKMBINDEX, TROPONINI in the last 168 hours. BNP: Invalid input(s): POCBNP CBG: No results for input(s): GLUCAP in the  last 168 hours. D-Dimer No results for input(s): DDIMER in the last 72 hours. Hgb A1c No results for input(s): HGBA1C in the last 72 hours. Lipid Profile No results for input(s): CHOL, HDL, LDLCALC, TRIG, CHOLHDL, LDLDIRECT in the last 72 hours. Thyroid function studies No results for input(s): TSH, T4TOTAL, T3FREE, THYROIDAB in the last 72 hours.  Invalid input(s): FREET3 Anemia work up No results for input(s): VITAMINB12, FOLATE, FERRITIN, TIBC, IRON, RETICCTPCT in the last 72 hours. Urinalysis No results found for: COLORURINE, APPEARANCEUR, LABSPEC, [place], GLUCOSEU, HGBUR, BILIRUBINUR, KETONESUR, PROTEINUR, UROBILINOGEN, NITRITE, LEUKOCYTESUR Sepsis Labs Invalid input(s): PROCALCITONIN,  WBC,  LACTICIDVEN Microbiology No results found for this or any previous visit (from the past 240 hour(s)).   Time coordinating discharge: 32 minutes  SIGNED:   Hosie Poisson, MD  [facility] 01/10/2018, 8:27 AM Pager   If 7PM-7AM, please contact night-coverage [email or web address] Password [email or web address]

## 2018-01-15 DIAGNOSIS — I639 Cerebral infarction, unspecified: Secondary | ICD-10-CM | POA: Diagnosis not present

## 2018-01-15 DIAGNOSIS — E7800 Pure hypercholesterolemia, unspecified: Secondary | ICD-10-CM | POA: Diagnosis not present

## 2018-01-15 DIAGNOSIS — I10 Essential (primary) hypertension: Secondary | ICD-10-CM | POA: Diagnosis not present

## 2018-01-23 DIAGNOSIS — Z8673 Personal history of transient ischemic attack (TIA), and cerebral infarction without residual deficits: Secondary | ICD-10-CM | POA: Diagnosis not present

## 2018-02-11 ENCOUNTER — Ambulatory Visit (INDEPENDENT_AMBULATORY_CARE_PROVIDER_SITE_OTHER): Payer: Medicare Other | Admitting: Adult Health

## 2018-02-11 ENCOUNTER — Encounter: Payer: Self-pay | Admitting: Adult Health

## 2018-02-11 VITALS — BP 145/64 | HR 63 | Ht 68.0 in | Wt 166.2 lb

## 2018-02-11 DIAGNOSIS — I10 Essential (primary) hypertension: Secondary | ICD-10-CM | POA: Diagnosis not present

## 2018-02-11 DIAGNOSIS — E785 Hyperlipidemia, unspecified: Secondary | ICD-10-CM

## 2018-02-11 DIAGNOSIS — I63512 Cerebral infarction due to unspecified occlusion or stenosis of left middle cerebral artery: Secondary | ICD-10-CM

## 2018-02-11 NOTE — Patient Instructions (Signed)
Continue aspirin 325 mg daily  and lipitor 20mg   for secondary stroke prevention  Continue to follow up with PCP regarding cholesterol and blood pressure management   Continue to do home exercises for continued hand weakness  Continue to monitor blood pressure at home  Maintain strict control of hypertension with blood pressure goal below 130/90, diabetes with hemoglobin A1c goal below 6.5% and cholesterol with LDL cholesterol (bad cholesterol) goal below 70 mg/dL. I also advised the patient to eat a healthy diet with plenty of whole grains, cereals, fruits and vegetables, exercise regularly and maintain ideal body weight.  Followup in the future with me in 3 months or call earlier if needed       Thank you for coming to see [place] at [facility]. I hope we have been able to provide you high quality care today.  You may receive a patient satisfaction survey over the next few weeks. We would appreciate your feedback and comments so that we may continue to improve ourselves and the health of our patients.

## 2018-02-11 NOTE — Progress Notes (Signed)
I agree with the above plan 

## 2018-02-11 NOTE — Progress Notes (Signed)
[facility] [address] [place]. [place] [address] [phone]       OFFICE FOLLOW UP NOTE  Ms. Ashley Phillips Date of Birth:  1935/04/26 Medical Record Number:  [id number]   Reason for Referral:  hospital stroke follow up  CHIEF COMPLAINT:  Chief Complaint  Patient presents with  . Follow-up    Follow up for hospital Stroke room 9 pt alone p has not dme walker or cane    HPI: Ashley Phillips is being seen today for initial visit in the office for left BG/CR infarct likely due to small vessel disease on 01/02/2018. History obtained from patient and chart review. Reviewed all radiology images and labs personally.  Ms. Ashley Phillips is a 83 y.o. female with history of hypertension  who presented with Rt arm weakness. She did not receive IV t-PA due to late presentation.   CT head reviewed and showed a small age-indeterminate lacunar infarct in the left nasal cannula.  MRI brain reviewed and showed 2.3 cm acute ischemic nonhemorrhagic perforator type infarct involving the posterior left basal ganglia.  CTA head and neck are negative for large vessel occlusion but did show right P2 proximal short segment moderate stenosis.  2D echo showed an EF of 50 to 55%.  LDL 140 and recommended starting Lipitor 20 mg daily.  HTN stable during admission and recommended BP goal normotensive range.  Patient is not on antithrombotic PTA and recommended DAPT for 3 weeks then aspirin alone.  Per notes, patient was currently taking estrogen supplementation since her hysterectomy 35 years prior and it was recommended to follow-up with OB/GYN to see if this can be discontinued.  Patient was discharged home with spouse in stable condition with recommendations of outpatient therapy.  Patient is being seen today for hospital follow-up.  She does continue to have right hand dexterity weakness but otherwise feels as though she has been improving and otherwise stable.  She has completed therapies but does  continue to do home exercises on her own due to continued hand weakness.  She has completed 3 weeks DAPT and is currently on aspirin 325 mg daily without side effects of bleeding or bruising.  She has continued to take atorvastatin 20 mg daily with questionable statin pains along with increased urination.  She states she was having knee pain prior but feels as though it is worsened with an increased stiffness upon standing but does state this also could be due to arthritis and at this time the stiffness/pain is tolerable.  She also states that on the information provided from the pharmacy in regards to the atorvastatin states that it could increase urination frequency and does feel as though her voiding habits have increased especially during the nighttime hours.  She denies any type of painful urination, abdominal/back pain or hematuria.  Patient does prefer to continue at this time taking atorvastatin in hopes that the side effects will subside.  Blood pressure today satisfactory 145/64 but this is elevated per patient as she does monitor at home and typical SBP 130.  Denies new or worsening stroke/TIA symptoms.  ROS:   14 system review of systems performed and negative with exception of fatigue swelling in legs, easy bruising, allergies, runny nose, and weakness  PMH:  Past Medical History:  Diagnosis Date  . Hypertension   . Stroke [facility])     PSH: History reviewed. No pertinent surgical history.  Social History:  Social History   Socioeconomic History  . Marital  status: Married    Spouse name: Not on file  . Number of children: Not on file  . Years of education: Not on file  . Highest education level: Not on file  Occupational History  . Not on file  Social Needs  . Financial resource strain: Not on file  . Food insecurity:    Worry: Not on file    Inability: Not on file  . Transportation needs:    Medical: Not on file    Non-medical: Not on file  Tobacco Use  . Smoking status:  Never Smoker  . Smokeless tobacco: Never Used  Substance and Sexual Activity  . Alcohol use: Not Currently  . Drug use: Not Currently  . Sexual activity: Not on file  Lifestyle  . Physical activity:    Days per week: Not on file    Minutes per session: Not on file  . Stress: Not on file  Relationships  . Social connections:    Talks on phone: Not on file    Gets together: Not on file    Attends religious service: Not on file    Active member of club or organization: Not on file    Attends meetings of clubs or organizations: Not on file    Relationship status: Not on file  . Intimate partner violence:    Fear of current or ex partner: Not on file    Emotionally abused: Not on file    Physically abused: Not on file    Forced sexual activity: Not on file  Other Topics Concern  . Not on file  Social History Narrative  . Not on file    Family History:  Family History  Problem Relation Age of Onset  . Hypertension Mother   . Hypertension Father     Medications:   Current Outpatient Medications on File Prior to Visit  Medication Sig Dispense Refill  . aspirin EC 325 MG tablet Take 1 tablet (325 mg total) by mouth daily. 30 tablet 0  . atorvastatin (LIPITOR) 20 MG tablet Take 1 tablet (20 mg total) by mouth daily at 6 PM. 30 tablet 0  . Calcium Carb-Cholecalciferol (OS-CAL EXTRA D3) 500-600 MG-UNIT TABS Take 1 tablet by mouth daily.    . cholecalciferol (VITAMIN D) 1000 units tablet Take 1,000 Units by mouth daily.    . fluticasone (FLONASE) 50 MCG/ACT nasal spray Place 2 sprays into both nostrils daily as needed for allergies or rhinitis.    Marland Kitchen loratadine (CLARITIN) 10 MG tablet Take 10 mg by mouth daily as needed for allergies.     Marland Kitchen losartan-hydrochlorothiazide (HYZAAR) 100-12.5 MG tablet Take 1 tablet by mouth daily.    . metoprolol succinate (TOPROL-XL) 100 MG 24 hr tablet Take 100 mg by mouth daily.     No current facility-administered medications on file prior to visit.      Allergies:   Allergies  Allergen Reactions  . Lisinopril Swelling    Face was numb, lips was swollen.  . Prednisone Swelling    Face turns red.     Physical Exam  Vitals:   02/11/18 1232  BP: (!) 145/64  Pulse: 63  Weight: 166 lb 3.2 oz (75.4 kg)  Height: 5\' 8"  (1.727 m)   Body mass index is 25.27 kg/m. No exam data present  General: well developed, well nourished, pleasant elderly Caucasian female, seated, in no evident distress Head: head normocephalic and atraumatic.   Neck: supple with no carotid or supraclavicular bruits Cardiovascular: regular  rate and rhythm, no murmurs Musculoskeletal: no deformity Skin:  no rash/petichiae Vascular:  Normal pulses all extremities  Neurologic Exam Mental Status: Awake and fully alert. Oriented to place and time. Recent and remote memory intact. Attention span, concentration and fund of knowledge appropriate. Mood and affect appropriate.  Cranial Nerves: Fundoscopic exam reveals sharp disc margins. Pupils equal, briskly reactive to light. Extraocular movements full without nystagmus. Visual fields full to confrontation. Hearing intact. Facial sensation intact. Face, tongue, palate moves normally and symmetrically.  Motor: Normal bulk and tone. Normal strength in all tested extremity muscles. Sensory.: intact to touch , pinprick , position and vibratory sensation.  Coordination: Rapid alternating movements normal in all extremities. Finger-to-nose and heel-to-shin performed accurately bilaterally.  Decreased right hand dexterity Gait and Station: Arises from chair without difficulty. Stance is normal. Gait demonstrates normal stride length and balance . Able to heel, toe and tandem walk without difficulty.  Reflexes: 1+ and symmetric. Toes downgoing.    NIHSS  0 Modified Rankin  2    Diagnostic Data (Labs, Imaging, Testing)  CT HEAD WO CONTRAST 01/02/2018 IMPRESSION: 1. Small age-indeterminate lacunar infarct in the left  basal Ganglia.  MR BRAIN WO CONTRAST 01/02/2018 IMPRESSION: 1. 2.3 cm acute ischemic nonhemorrhagic perforator type infarct involving the posterior left basal ganglia. 2. Mild chronic microvascular ischemic disease.  CT ANGIO HEAD W OR WO CONTRAST CT ANGIO NECK W OR WO CONTRAST 01/02/2018 IMPRESSION: 1. Negative CTA for large vessel occlusion. 2. Relatively minor atherosclerotic change involving the major arterial vasculature of the neck for patient age. No hemodynamically significant stenosis. 3. Atheromatous change involving the bilateral PCAs, with single short-segment moderate proximal right P2 stenosis. Otherwise negative intracranial CTA. No other hemodynamically significant or correctable stenosis. 4. 4 mm left lower lobe pulmonary nodule, indeterminate. No follow-up needed if patient is low-risk. Non-contrast chest CT can be considered in 12 months if patient is high-risk. This recommendation follows the consensus statement: Guidelines for Management of Incidental Pulmonary Nodules Detected on CT Images: From the Fleischner Society 2017; Radiology 2017; 284:228-243. 5. 15 mm precarinal lymph node, nonspecific, but most commonly reactive.  ECHOCARDIOGRAM 01/03/2018 Study Conclusions - Left ventricle: The cavity size was normal. There was mild focal   basal hypertrophy of the septum. Systolic function was normal.   The estimated ejection fraction was in the range of 50% to 55%.   Wall motion was normal; there were no regional wall motion   abnormalities. Features are consistent with a pseudonormal left   ventricular filling pattern, with concomitant abnormal relaxation   and increased filling pressure (grade 2 diastolic dysfunction). - Aortic valve: There was moderate regurgitation. Regurgitation   pressure half-time: 348 ms. - Mitral valve: There was mild regurgitation. - Right ventricle: Systolic function was normal. - Atrial septum: No defect or patent foramen ovale  was identified. - Tricuspid valve: There was trivial regurgitation. - Pulmonic valve: There was trivial regurgitation. - Pulmonary arteries: Systolic pressure was mildly increased. PA   peak pressure: 32 mm Hg (S).    ASSESSMENT: Ashley Phillips is a 83 y.o. year old female here with left basal ganglia infarct on 01/02/2018 secondary to small vessel disease. Vascular risk factors include HTN and HLD.  Patient is being seen today for hospital follow-up and does continue to have decreased right hand dexterity but otherwise has been stable.    PLAN: -Continue aspirin 325 mg daily  and Lipitor 20 mg for secondary stroke prevention -F/u with PCP regarding your HTN and  HLD management -advised to notify us or PCP if continued concerns possible with atorvastatin side effects -Advised to continue to do home exercises for continued hand weakness -continue to monitor BP at home -advised to continue to stay active and maintain a healthy diet -Maintain strict control of hypertension with blood pressure goal below 130/90, diabetes with hemoglobin A1c goal below 6.5% and cholesterol with LDL cholesterol (bad cholesterol) goal below 70 mg/dL. I also advised the patient to eat a healthy diet with plenty of whole grains, cereals, fruits and vegetables, exercise regularly and maintain ideal body weight.  Follow up in 3 months or call earlier if needed   Greater than 50% of time during this 25 minute visit was spent on counseling,explanation of diagnosis of left BG infarct, reviewing risk factor management of HTN and HLD, planning of further management, discussion with patient and family and coordination of care    Venancio Poisson, AGNP-BC  [facility] [address] [place], [place] [address]  Phone [phone] Fax [phone] Note: This document was prepared with digital dictation and possible smart phrase technology. Any transcriptional errors that result from this  process are unintentional.

## 2018-02-17 DIAGNOSIS — Z23 Encounter for immunization: Secondary | ICD-10-CM | POA: Diagnosis not present

## 2018-02-17 DIAGNOSIS — E539 Vitamin B deficiency, unspecified: Secondary | ICD-10-CM | POA: Diagnosis not present

## 2018-04-01 DIAGNOSIS — L821 Other seborrheic keratosis: Secondary | ICD-10-CM | POA: Diagnosis not present

## 2018-04-01 DIAGNOSIS — Z23 Encounter for immunization: Secondary | ICD-10-CM | POA: Diagnosis not present

## 2018-04-01 DIAGNOSIS — D2262 Melanocytic nevi of left upper limb, including shoulder: Secondary | ICD-10-CM | POA: Diagnosis not present

## 2018-04-01 DIAGNOSIS — D2372 Other benign neoplasm of skin of left lower limb, including hip: Secondary | ICD-10-CM | POA: Diagnosis not present

## 2018-04-01 DIAGNOSIS — Z85828 Personal history of other malignant neoplasm of skin: Secondary | ICD-10-CM | POA: Diagnosis not present

## 2018-04-01 DIAGNOSIS — D2230 Melanocytic nevi of unspecified part of face: Secondary | ICD-10-CM | POA: Diagnosis not present

## 2018-04-01 DIAGNOSIS — D225 Melanocytic nevi of trunk: Secondary | ICD-10-CM | POA: Diagnosis not present

## 2018-04-01 DIAGNOSIS — L738 Other specified follicular disorders: Secondary | ICD-10-CM | POA: Diagnosis not present

## 2018-04-24 DIAGNOSIS — E7800 Pure hypercholesterolemia, unspecified: Secondary | ICD-10-CM | POA: Diagnosis not present

## 2018-04-24 DIAGNOSIS — I10 Essential (primary) hypertension: Secondary | ICD-10-CM | POA: Diagnosis not present

## 2018-04-24 DIAGNOSIS — G609 Hereditary and idiopathic neuropathy, unspecified: Secondary | ICD-10-CM | POA: Diagnosis not present

## 2018-04-24 DIAGNOSIS — M8580 Other specified disorders of bone density and structure, unspecified site: Secondary | ICD-10-CM | POA: Diagnosis not present

## 2018-04-24 DIAGNOSIS — D510 Vitamin B12 deficiency anemia due to intrinsic factor deficiency: Secondary | ICD-10-CM | POA: Diagnosis not present

## 2018-04-24 DIAGNOSIS — I6930 Unspecified sequelae of cerebral infarction: Secondary | ICD-10-CM | POA: Diagnosis not present

## 2018-05-14 ENCOUNTER — Ambulatory Visit: Payer: Medicare Other | Admitting: Adult Health

## 2018-06-04 DIAGNOSIS — E7800 Pure hypercholesterolemia, unspecified: Secondary | ICD-10-CM | POA: Diagnosis not present

## 2018-06-04 DIAGNOSIS — G609 Hereditary and idiopathic neuropathy, unspecified: Secondary | ICD-10-CM | POA: Diagnosis not present

## 2018-06-04 DIAGNOSIS — I10 Essential (primary) hypertension: Secondary | ICD-10-CM | POA: Diagnosis not present

## 2018-06-25 DIAGNOSIS — D510 Vitamin B12 deficiency anemia due to intrinsic factor deficiency: Secondary | ICD-10-CM | POA: Diagnosis not present

## 2018-07-08 DIAGNOSIS — H04123 Dry eye syndrome of bilateral lacrimal glands: Secondary | ICD-10-CM | POA: Diagnosis not present

## 2018-07-08 DIAGNOSIS — H2703 Aphakia, bilateral: Secondary | ICD-10-CM | POA: Diagnosis not present

## 2018-09-24 DIAGNOSIS — D510 Vitamin B12 deficiency anemia due to intrinsic factor deficiency: Secondary | ICD-10-CM | POA: Diagnosis not present

## 2018-10-27 ENCOUNTER — Encounter: Payer: Self-pay | Admitting: Neurology

## 2018-10-27 DIAGNOSIS — I10 Essential (primary) hypertension: Secondary | ICD-10-CM | POA: Diagnosis not present

## 2018-10-27 DIAGNOSIS — R5383 Other fatigue: Secondary | ICD-10-CM | POA: Diagnosis not present

## 2018-10-27 DIAGNOSIS — G609 Hereditary and idiopathic neuropathy, unspecified: Secondary | ICD-10-CM | POA: Diagnosis not present

## 2018-10-28 ENCOUNTER — Other Ambulatory Visit: Payer: Self-pay

## 2018-10-28 DIAGNOSIS — R200 Anesthesia of skin: Secondary | ICD-10-CM

## 2018-10-30 DIAGNOSIS — Z1389 Encounter for screening for other disorder: Secondary | ICD-10-CM | POA: Diagnosis not present

## 2018-10-30 DIAGNOSIS — Z0000 Encounter for general adult medical examination without abnormal findings: Secondary | ICD-10-CM | POA: Diagnosis not present

## 2018-11-03 DIAGNOSIS — M6281 Muscle weakness (generalized): Secondary | ICD-10-CM | POA: Diagnosis not present

## 2018-11-03 DIAGNOSIS — R262 Difficulty in walking, not elsewhere classified: Secondary | ICD-10-CM | POA: Diagnosis not present

## 2018-11-03 DIAGNOSIS — R2689 Other abnormalities of gait and mobility: Secondary | ICD-10-CM | POA: Diagnosis not present

## 2018-11-05 DIAGNOSIS — R262 Difficulty in walking, not elsewhere classified: Secondary | ICD-10-CM | POA: Diagnosis not present

## 2018-11-05 DIAGNOSIS — R2689 Other abnormalities of gait and mobility: Secondary | ICD-10-CM | POA: Diagnosis not present

## 2018-11-05 DIAGNOSIS — M6281 Muscle weakness (generalized): Secondary | ICD-10-CM | POA: Diagnosis not present

## 2018-11-10 DIAGNOSIS — M6281 Muscle weakness (generalized): Secondary | ICD-10-CM | POA: Diagnosis not present

## 2018-11-10 DIAGNOSIS — R2689 Other abnormalities of gait and mobility: Secondary | ICD-10-CM | POA: Diagnosis not present

## 2018-11-10 DIAGNOSIS — R262 Difficulty in walking, not elsewhere classified: Secondary | ICD-10-CM | POA: Diagnosis not present

## 2018-11-12 DIAGNOSIS — R2689 Other abnormalities of gait and mobility: Secondary | ICD-10-CM | POA: Diagnosis not present

## 2018-11-12 DIAGNOSIS — M6281 Muscle weakness (generalized): Secondary | ICD-10-CM | POA: Diagnosis not present

## 2018-11-12 DIAGNOSIS — R262 Difficulty in walking, not elsewhere classified: Secondary | ICD-10-CM | POA: Diagnosis not present

## 2018-11-18 DIAGNOSIS — I10 Essential (primary) hypertension: Secondary | ICD-10-CM | POA: Diagnosis not present

## 2018-11-18 DIAGNOSIS — R2689 Other abnormalities of gait and mobility: Secondary | ICD-10-CM | POA: Diagnosis not present

## 2018-11-18 DIAGNOSIS — R262 Difficulty in walking, not elsewhere classified: Secondary | ICD-10-CM | POA: Diagnosis not present

## 2018-11-18 DIAGNOSIS — M6281 Muscle weakness (generalized): Secondary | ICD-10-CM | POA: Diagnosis not present

## 2018-11-20 DIAGNOSIS — M6281 Muscle weakness (generalized): Secondary | ICD-10-CM | POA: Diagnosis not present

## 2018-11-20 DIAGNOSIS — R2689 Other abnormalities of gait and mobility: Secondary | ICD-10-CM | POA: Diagnosis not present

## 2018-11-20 DIAGNOSIS — I10 Essential (primary) hypertension: Secondary | ICD-10-CM | POA: Diagnosis not present

## 2018-11-20 DIAGNOSIS — R262 Difficulty in walking, not elsewhere classified: Secondary | ICD-10-CM | POA: Diagnosis not present

## 2018-11-25 DIAGNOSIS — M6281 Muscle weakness (generalized): Secondary | ICD-10-CM | POA: Diagnosis not present

## 2018-11-25 DIAGNOSIS — I10 Essential (primary) hypertension: Secondary | ICD-10-CM | POA: Diagnosis not present

## 2018-11-25 DIAGNOSIS — R2689 Other abnormalities of gait and mobility: Secondary | ICD-10-CM | POA: Diagnosis not present

## 2018-11-25 DIAGNOSIS — R262 Difficulty in walking, not elsewhere classified: Secondary | ICD-10-CM | POA: Diagnosis not present

## 2018-11-27 DIAGNOSIS — R2689 Other abnormalities of gait and mobility: Secondary | ICD-10-CM | POA: Diagnosis not present

## 2018-11-27 DIAGNOSIS — R262 Difficulty in walking, not elsewhere classified: Secondary | ICD-10-CM | POA: Diagnosis not present

## 2018-11-27 DIAGNOSIS — M6281 Muscle weakness (generalized): Secondary | ICD-10-CM | POA: Diagnosis not present

## 2018-12-01 DIAGNOSIS — M6281 Muscle weakness (generalized): Secondary | ICD-10-CM | POA: Diagnosis not present

## 2018-12-01 DIAGNOSIS — R2689 Other abnormalities of gait and mobility: Secondary | ICD-10-CM | POA: Diagnosis not present

## 2018-12-01 DIAGNOSIS — R262 Difficulty in walking, not elsewhere classified: Secondary | ICD-10-CM | POA: Diagnosis not present

## 2018-12-02 ENCOUNTER — Ambulatory Visit (INDEPENDENT_AMBULATORY_CARE_PROVIDER_SITE_OTHER): Payer: Medicare Other | Admitting: Neurology

## 2018-12-02 ENCOUNTER — Other Ambulatory Visit: Payer: Self-pay

## 2018-12-02 DIAGNOSIS — R200 Anesthesia of skin: Secondary | ICD-10-CM | POA: Diagnosis not present

## 2018-12-02 DIAGNOSIS — G629 Polyneuropathy, unspecified: Secondary | ICD-10-CM

## 2018-12-02 DIAGNOSIS — D510 Vitamin B12 deficiency anemia due to intrinsic factor deficiency: Secondary | ICD-10-CM | POA: Diagnosis not present

## 2018-12-02 NOTE — Procedures (Signed)
[facility]  [address]  [place], [place] [address] Tel: [phone] Fax:  [phone] Test Date:  12/02/2018  Patient: Ashley Phillips DOB: 19-Dec-1934 Physician: Narda Amber, DO  Sex: Female Height: 5\' 8"  Ref Phys: Lavone Orn, MD  ID#: [id number] Temp: 33.0C Technician:    Patient Complaints: This is an 84 year old female with history of vitamin B12 deficiency referred for evaluation of numbness of the feet and lower legs.  NCV & EMG Findings: Extensive electrodiagnostic testing of the right lower extremity and additional studies of the left shows:  1. Bilateral sural and superficial peroneal sensory responses are absent 2. Bilateral peroneal motor responses are absent at the extensor digitorum brevis, and normal at the tibialis anterior.  Bilateral tibial motor responses are within normal limits. 3. Bilateral tibial H reflex study shows prolonged latencies. 4. Chronic motor axonal loss changes are seen affecting the muscles below the knee bilaterally, without accompanied active denervation.  Impression: The electrophysiologic findings are consistent with a chronic and symmetric sensorimotor axonal polyneuropathy affecting the lower extremities.   ___________________________ Narda Amber, DO    Nerve Conduction Studies Anti Sensory Summary Table   Site NR Peak (ms) Norm Peak (ms) P-T Amp (V) Norm P-T Amp  Left Sup Peroneal Anti Sensory (Ant Lat Mall)  33C  12 cm NR  <4.6  >3  Right Sup Peroneal Anti Sensory (Ant Lat Mall)  33C  12 cm NR  <4.6  >3  Left Sural Anti Sensory (Lat Mall)  33C  Calf NR  <4.6  >3  Right Sural Anti Sensory (Lat Mall)  33C  Calf NR  <4.6  >3   Motor Summary Table   Site NR Onset (ms) Norm Onset (ms) O-P Amp (mV) Norm O-P Amp Site1 Site2 Delta-0 (ms) Dist (cm) Vel (m/s) Norm Vel (m/s)  Left Peroneal Motor (Ext Dig Brev)  33C  Ankle NR  <6.0  >2.5 B Fib Ankle  0.0  >40  B Fib NR     Poplt B Fib  0.0  >40  Poplt  NR            Right Peroneal Motor (Ext Dig Brev)  33C  Ankle NR  <6.0  >2.5 B Fib Ankle  0.0  >40  B Fib NR     Poplt B Fib  0.0  >40  Poplt NR            Left Peroneal TA Motor (Tib Ant)  33C  Fib Head    2.7 <4.5 3.0 >3 Poplit Fib Head 1.9 8.0 42 >40  Poplit    4.6  2.8         Right Peroneal TA Motor (Tib Ant)  33C  Fib Head    3.9 <4.5 3.1 >3 Poplit Fib Head 1.6 8.0 50 >40  Poplit    5.5  3.0         Left Tibial Motor (Abd Hall Brev)  33C  Ankle    3.6 <6.0 9.4 >4 Knee Ankle 10.6 42.0 40 >40  Knee    14.2  5.7         Right Tibial Motor (Abd Hall Brev)  33C  Ankle    3.4 <6.0 13.4 >4 Knee Ankle 10.0 40.0 40 >40  Knee    13.4  8.1          H Reflex Studies   NR H-Lat (ms) Lat Norm (ms) L-R H-Lat (ms)  Left Tibial (Gastroc)  33C  40.00 <35 0.00  Right Tibial (Gastroc)  33C     40.00 <35 0.00   EMG   Side Muscle Ins Act Fibs Psw Fasc Number Recrt Dur Dur. Amp Amp. Poly Poly. Comment  Right AntTibialis Nml Nml Nml Nml 1- Rapid Few 1+ Few 1+ Nml Nml N/A  Right Gastroc Nml Nml Nml Nml 1- Rapid Few 1+ Few 1+ Nml Nml N/A  Right Flex Dig Long Nml Nml Nml Nml 2- Rapid Some 1+ Some 1+ Nml Nml N/A  Right RectFemoris Nml Nml Nml Nml Nml Nml Nml Nml Nml Nml Nml Nml N/A  Right GluteusMed Nml Nml Nml Nml Nml Nml Nml Nml Nml Nml Nml Nml N/A  Right BicepsFemS Nml Nml Nml Nml Nml Nml Nml Nml Nml Nml Nml Nml N/A  Left BicepsFemS Nml Nml Nml Nml Nml Nml Nml Nml Nml Nml Nml Nml N/A  Left AntTibialis Nml Nml Nml Nml 1- Rapid Few 1+ Few 1+ Nml Nml N/A  Left Gastroc Nml Nml Nml Nml 1- Rapid Few 1+ Few 1+ Nml Nml N/A  Left Flex Dig Long Nml Nml Nml Nml 2- Rapid Some 1+ Some 1+ Nml Nml N/A  Left RectFemoris Nml Nml Nml Nml Nml Nml Nml Nml Nml Nml Nml Nml N/A  Left GluteusMed Nml Nml Nml Nml Nml Nml Nml Nml Nml Nml Nml Nml N/A      Waveforms:

## 2018-12-03 DIAGNOSIS — M6281 Muscle weakness (generalized): Secondary | ICD-10-CM | POA: Diagnosis not present

## 2018-12-03 DIAGNOSIS — R262 Difficulty in walking, not elsewhere classified: Secondary | ICD-10-CM | POA: Diagnosis not present

## 2018-12-03 DIAGNOSIS — R2689 Other abnormalities of gait and mobility: Secondary | ICD-10-CM | POA: Diagnosis not present

## 2018-12-08 DIAGNOSIS — R2689 Other abnormalities of gait and mobility: Secondary | ICD-10-CM | POA: Diagnosis not present

## 2018-12-08 DIAGNOSIS — M6281 Muscle weakness (generalized): Secondary | ICD-10-CM | POA: Diagnosis not present

## 2018-12-08 DIAGNOSIS — R262 Difficulty in walking, not elsewhere classified: Secondary | ICD-10-CM | POA: Diagnosis not present

## 2018-12-10 DIAGNOSIS — M6281 Muscle weakness (generalized): Secondary | ICD-10-CM | POA: Diagnosis not present

## 2018-12-10 DIAGNOSIS — R2689 Other abnormalities of gait and mobility: Secondary | ICD-10-CM | POA: Diagnosis not present

## 2018-12-10 DIAGNOSIS — R262 Difficulty in walking, not elsewhere classified: Secondary | ICD-10-CM | POA: Diagnosis not present

## 2018-12-15 DIAGNOSIS — M6281 Muscle weakness (generalized): Secondary | ICD-10-CM | POA: Diagnosis not present

## 2018-12-15 DIAGNOSIS — R262 Difficulty in walking, not elsewhere classified: Secondary | ICD-10-CM | POA: Diagnosis not present

## 2018-12-15 DIAGNOSIS — R2689 Other abnormalities of gait and mobility: Secondary | ICD-10-CM | POA: Diagnosis not present

## 2018-12-17 DIAGNOSIS — R2689 Other abnormalities of gait and mobility: Secondary | ICD-10-CM | POA: Diagnosis not present

## 2018-12-17 DIAGNOSIS — M6281 Muscle weakness (generalized): Secondary | ICD-10-CM | POA: Diagnosis not present

## 2018-12-17 DIAGNOSIS — R262 Difficulty in walking, not elsewhere classified: Secondary | ICD-10-CM | POA: Diagnosis not present

## 2018-12-22 DIAGNOSIS — R2689 Other abnormalities of gait and mobility: Secondary | ICD-10-CM | POA: Diagnosis not present

## 2018-12-22 DIAGNOSIS — M6281 Muscle weakness (generalized): Secondary | ICD-10-CM | POA: Diagnosis not present

## 2018-12-22 DIAGNOSIS — R262 Difficulty in walking, not elsewhere classified: Secondary | ICD-10-CM | POA: Diagnosis not present

## 2018-12-24 DIAGNOSIS — M6281 Muscle weakness (generalized): Secondary | ICD-10-CM | POA: Diagnosis not present

## 2018-12-24 DIAGNOSIS — R2689 Other abnormalities of gait and mobility: Secondary | ICD-10-CM | POA: Diagnosis not present

## 2018-12-24 DIAGNOSIS — R262 Difficulty in walking, not elsewhere classified: Secondary | ICD-10-CM | POA: Diagnosis not present

## 2018-12-29 DIAGNOSIS — M6281 Muscle weakness (generalized): Secondary | ICD-10-CM | POA: Diagnosis not present

## 2018-12-29 DIAGNOSIS — R262 Difficulty in walking, not elsewhere classified: Secondary | ICD-10-CM | POA: Diagnosis not present

## 2018-12-29 DIAGNOSIS — R2689 Other abnormalities of gait and mobility: Secondary | ICD-10-CM | POA: Diagnosis not present

## 2019-02-02 DIAGNOSIS — Z23 Encounter for immunization: Secondary | ICD-10-CM | POA: Diagnosis not present

## 2019-02-02 DIAGNOSIS — D510 Vitamin B12 deficiency anemia due to intrinsic factor deficiency: Secondary | ICD-10-CM | POA: Diagnosis not present

## 2019-03-30 DIAGNOSIS — D510 Vitamin B12 deficiency anemia due to intrinsic factor deficiency: Secondary | ICD-10-CM | POA: Diagnosis not present

## 2019-05-13 ENCOUNTER — Encounter: Payer: Self-pay | Admitting: Neurology

## 2019-06-01 DIAGNOSIS — D510 Vitamin B12 deficiency anemia due to intrinsic factor deficiency: Secondary | ICD-10-CM | POA: Diagnosis not present

## 2019-06-08 ENCOUNTER — Encounter: Payer: Self-pay | Admitting: Neurology

## 2019-06-08 ENCOUNTER — Ambulatory Visit (INDEPENDENT_AMBULATORY_CARE_PROVIDER_SITE_OTHER): Payer: Medicare Other | Admitting: Neurology

## 2019-06-08 ENCOUNTER — Other Ambulatory Visit: Payer: Self-pay

## 2019-06-08 VITALS — BP 134/67 | HR 69 | Resp 16 | Ht 68.0 in | Wt 159.6 lb

## 2019-06-08 DIAGNOSIS — G629 Polyneuropathy, unspecified: Secondary | ICD-10-CM | POA: Diagnosis not present

## 2019-06-08 NOTE — Progress Notes (Signed)
[facility] Clinic Note - Initial Visit   Date: 06/08/19  Ashley Phillips MRN: [id number] DOB: 16-Jun-1934   Dear Dr. Laurann Montana:  Thank you for your kind referral of Ashley Phillips for consultation of neuropathy. Although her history is well known to you, please allow [place] to reiterate it for the purpose of our medical record. The patient was accompanied to the clinic by self.   History of Present Illness: Ashley Phillips is a 85 y.o. right-handed female with hypertension, vitamin B12 deficiency, left basal ganglia stroke (12/2017), osteoporosis and GERD presenting for evaluation of neuropathy.   Starting around 2009, she began having tingling over the soles of the feet.  Over the years, she has noticed increased numbness in the feet which is worse in the left foot, especially over the side of the foot.  She walks unassisted.  Balance is fair.  She has not suffered any falls. She denies any weakness in the feet.   She takes gabapentin 200mg  at bedtime which helps with burning pain.    No personal history of diabetes or alcohol use.  No family history of neuropathy. She has not driven since her stroke.  She had a stroke in 2019 and has residual right side weakness, which is mild and more noticeable in the hand.   Out-side paper records, electronic medical record, and images have been reviewed where available and summarized as:  NCS/EMG of the legs 12/02/2018:  The electrophysiologic findings are consistent with a chronic and symmetric sensorimotor axonal polyneuropathy affecting the lower extremities.  Labs 10/27/2018:  TSH 2.4, B12 562 Lab Results  Component Value Date   HGBA1C 5.5 01/03/2018    Past Medical History:  Diagnosis Date  . Hypertension   . Idiopathic neuropathy   . Stroke [facility])     Past Surgical History:  Procedure Laterality Date  . LAPAROSCOPIC ASSISTED VAGINAL HYSTERECTOMY       Medications:  Outpatient Encounter Medications as of  06/08/2019  Medication Sig  . aspirin EC 325 MG tablet Take 1 tablet (325 mg total) by mouth daily.  Marland Kitchen atorvastatin (LIPITOR) 20 MG tablet Take 1 tablet (20 mg total) by mouth daily at 6 PM.  . Calcium Carb-Cholecalciferol (OS-CAL EXTRA D3) 500-600 MG-UNIT TABS Take 1 tablet by mouth daily.  . cholecalciferol (VITAMIN D) 1000 units tablet Take 1,000 Units by mouth daily.  . fluticasone (FLONASE) 50 MCG/ACT nasal spray Place 2 sprays into both nostrils daily as needed for allergies or rhinitis.  Marland Kitchen loratadine (CLARITIN) 10 MG tablet Take 10 mg by mouth daily as needed for allergies.   Marland Kitchen losartan-hydrochlorothiazide (HYZAAR) 100-12.5 MG tablet Take 1 tablet by mouth daily.  . metoprolol succinate (TOPROL-XL) 100 MG 24 hr tablet Take 100 mg by mouth daily.   No facility-administered encounter medications on file as of 06/08/2019.    Allergies:  Allergies  Allergen Reactions  . Lisinopril Swelling    Face was numb, lips was swollen.  . Prednisone Swelling    Face turns red.    Family History: Family History  Problem Relation Age of Onset  . Hypertension Mother   . Hypertension Father     Social History: Social History   Tobacco Use  . Smoking status: Never Smoker  . Smokeless tobacco: Never Used  Substance Use Topics  . Alcohol use: Never  . Drug use: Not Currently   Social History   Social History Narrative   She lives with husband in a two-level home.  No children.    She retired from office work.    Vital Signs:  BP 134/67 (BP Location: Left Arm, Patient Position: Sitting)   Pulse 69   Resp 16   Ht 5\' 8"  (1.727 m)   Wt 159 lb 9.6 oz (72.4 kg)   SpO2 96%   BMI 24.27 kg/m    Neurological Exam: MENTAL STATUS including orientation to time, place, person, recent and remote memory, attention span and concentration, language, and fund of knowledge is normal.  Speech is not dysarthric.  CRANIAL NERVES: II:  No visual field defects.   III-IV-VI: Pupils equal round and  reactive to light.  Normal conjugate, extra-ocular eye movements in all directions of gaze.  No nystagmus.  No ptosis.   V:  Normal facial sensation.    VII:  Normal facial symmetry and movements.   VIII:  Normal hearing and vestibular function.   IX-X:  Normal palatal movement.   XI:  Normal shoulder shrug and head rotation.   XII:  Normal tongue strength and range of motion, no deviation or fasciculation.  MOTOR:  No atrophy, fasciculations or abnormal movements.  No pronator drift.   Upper Extremity:  Right  Left  Deltoid  5/5   5/5   Biceps  5/5   5/5   Triceps  5/5   5/5   Infraspinatus 5/5  5/5  Medial pectoralis 5/5  5/5  Wrist extensors  5/5   5/5   Wrist flexors  5/5   5/5   Finger extensors  5/5   5/5   Finger flexors  5/5   5/5   Dorsal interossei  5/5   5/5   Abductor pollicis  5/5   5/5   Tone (Ashworth scale)  0  0   Lower Extremity:  Right  Left  Hip flexors  5/5   5/5   Hip extensors  5/5   5/5   Adductor 5/5  5/5  Abductor 5/5  5/5  Knee flexors  5/5   5/5   Knee extensors  5/5   5/5   Dorsiflexors  5/5   5/5   Plantarflexors  5/5   5/5   Toe extensors  5/5   5/5   Toe flexors  5/5   5/5   Tone (Ashworth scale)  0  0   MSRs:  Right        Left                  brachioradialis 2+  2+  biceps 2+  2+  triceps 2+  2+  patellar 2+  2+  ankle jerk 2+  2+  Hoffman no  no  plantar response down  down   SENSORY:  Absent vibration distal to ankles bilaterally, temperature, pin prick, and light touch reduced in the feet. Sensation in the hands intact.  Romberg's sign is positive.  COORDINATION/GAIT: Normal finger-to- nose-finger.  Intact rapid alternating movements bilaterally. Gait appears unsteady, unassisted, and very cautious with small wide steps.  She is unable to stand on toes.  Heel walking intact.   IMPRESSION: This is a 85 year-old female referring for evaluation of peripheral neuropathy. Her neurological examination shows a distal predominant large  fiber peripheral neuropathy, which is worse in the left leg.  It's possible she may have overlapping S1 radiculopathy on the left especially as she reports worsening numbness over the lateral foot.  She has mild weakness distally with toe walking.  No back or radicular  leg pain.  Unless, she develops new radicular symptoms, no need for imaging at this time.  I had extensive discussion with the patient regarding the pathogenesis, etiology, management, and natural course of neuropathy. Neuropathy tends to be slowly progressive, especially if a treatable etiology is not identified. 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  Although she had history of B12, this is unlikely to be the cause of ongoing progression of neuropathy.  Patient was informed that in the majority of patients, we are unable to find the underlying etiology and management is symptomatic.  Understandably, she is disappointed that there is no cure or "fix" for neuropathy.    PLAN/RECOMMENDATIONS:  She is getting adequate relief with gabapentin 200mg  at bedtime, which can be continued. She has completed balance training and I encouraged her to continue home exercises. I urged her to start using a cane/walker, especially on uneven ground or unfamiliar areas Start using a shower chair to reduce risk of falls Check feet daily She had many questions which I addressed to the best of my ability.  Return to clinic as needed  Total time spent: 45 min   Thank you for allowing me to participate in patient's care.  If I can answer any additional questions, I would be pleased to do so.    Sincerely,    Ashley Funez K. Posey Pronto, DO

## 2019-06-08 NOTE — Patient Instructions (Addendum)
Continue home exercises  Continue gabapentin 200mg  at bedtime  Start using a cane, especially on uneven ground  Start using a shower chair/stool  Check your feet daily  Return to clinic as needed

## 2019-06-11 DIAGNOSIS — R194 Change in bowel habit: Secondary | ICD-10-CM | POA: Diagnosis not present

## 2019-06-11 DIAGNOSIS — K625 Hemorrhage of anus and rectum: Secondary | ICD-10-CM | POA: Diagnosis not present

## 2019-06-15 DIAGNOSIS — Z1159 Encounter for screening for other viral diseases: Secondary | ICD-10-CM | POA: Diagnosis not present

## 2019-06-18 DIAGNOSIS — K635 Polyp of colon: Secondary | ICD-10-CM | POA: Diagnosis not present

## 2019-06-18 DIAGNOSIS — K5900 Constipation, unspecified: Secondary | ICD-10-CM | POA: Diagnosis not present

## 2019-06-18 DIAGNOSIS — K648 Other hemorrhoids: Secondary | ICD-10-CM | POA: Diagnosis not present

## 2019-06-18 DIAGNOSIS — R194 Change in bowel habit: Secondary | ICD-10-CM | POA: Diagnosis not present

## 2019-06-18 DIAGNOSIS — K5730 Diverticulosis of large intestine without perforation or abscess without bleeding: Secondary | ICD-10-CM | POA: Diagnosis not present

## 2019-06-18 DIAGNOSIS — K625 Hemorrhage of anus and rectum: Secondary | ICD-10-CM | POA: Diagnosis not present

## 2019-06-19 DIAGNOSIS — Z23 Encounter for immunization: Secondary | ICD-10-CM | POA: Diagnosis not present

## 2019-06-23 DIAGNOSIS — K635 Polyp of colon: Secondary | ICD-10-CM | POA: Diagnosis not present

## 2019-07-17 DIAGNOSIS — Z23 Encounter for immunization: Secondary | ICD-10-CM | POA: Diagnosis not present

## 2019-07-30 DIAGNOSIS — D510 Vitamin B12 deficiency anemia due to intrinsic factor deficiency: Secondary | ICD-10-CM | POA: Diagnosis not present

## 2019-08-22 IMAGING — CT CT ANGIO NECK
1 of 8 series · 6 of 33 positions shown · IV contrast (OMNI 350)
Comparison: Prior CT from earlier the same day.

CLINICAL DATA: Initial evaluation for acute stroke.

EXAM:
CT ANGIOGRAPHY HEAD AND NECK
TECHNIQUE: Multidetector CT imaging of the head and neck was performed using
the standard protocol during bolus administration of intravenous
contrast. Multiplanar CT image reconstructions and MIPs were
obtained to evaluate the vascular anatomy. Carotid stenosis
measurements (when applicable) are obtained utilizing NASCET
criteria, using the distal internal carotid diameter as the
denominator.
CONTRAST:  50mL D45L1D-MDI IOPAMIDOL (D45L1D-MDI) INJECTION 76%

[Series 9: cta neck axial · axial · 0.36mm/px · z∈[-274,-12]mm · 6 of 368 slices shown]
[im 53/368  soft-tissue]
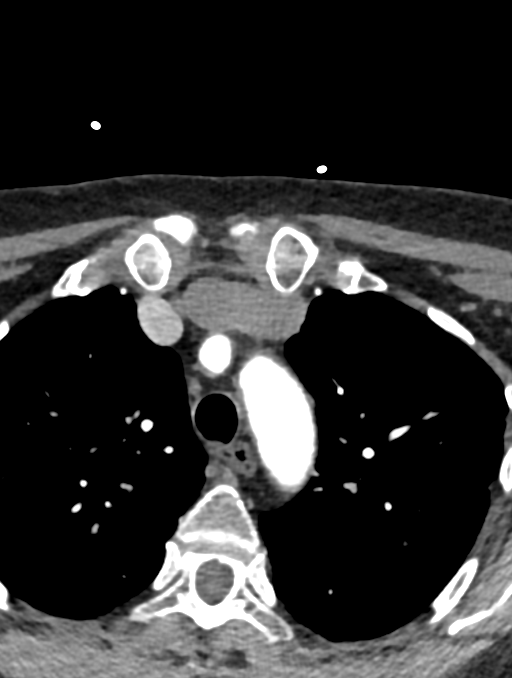
[im 105/368  bone]
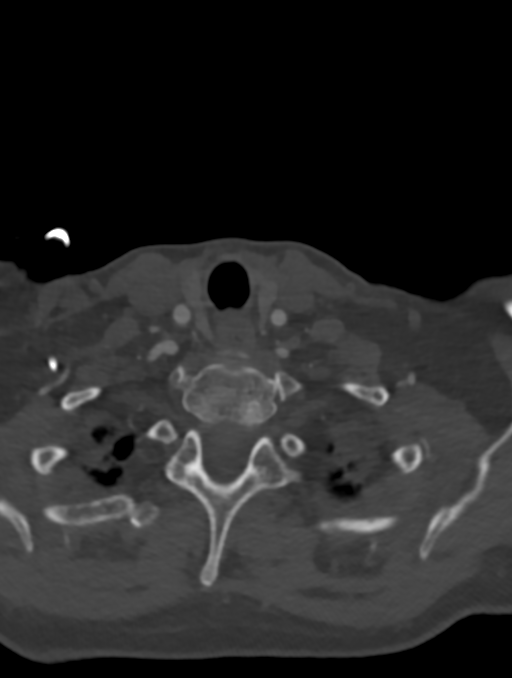
[im 158/368  soft-tissue]
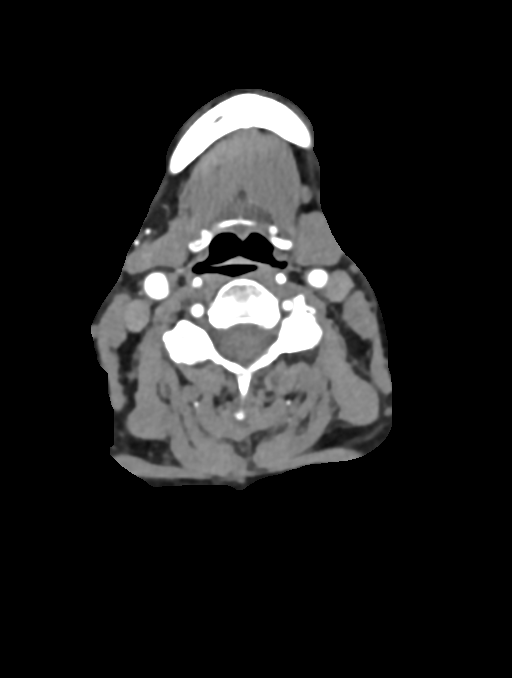
[im 210/368  bone]
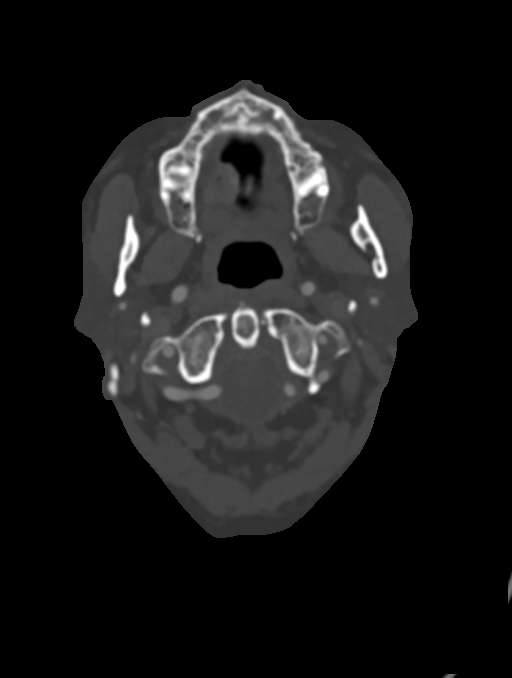
[im 263/368  soft-tissue]
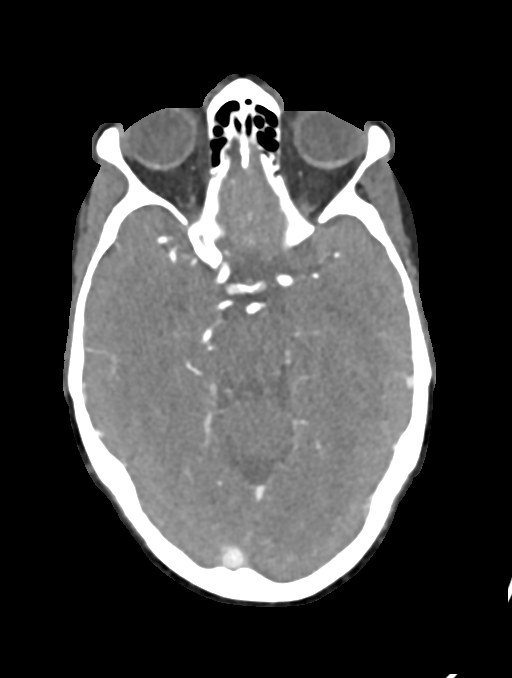
[im 315/368  bone]
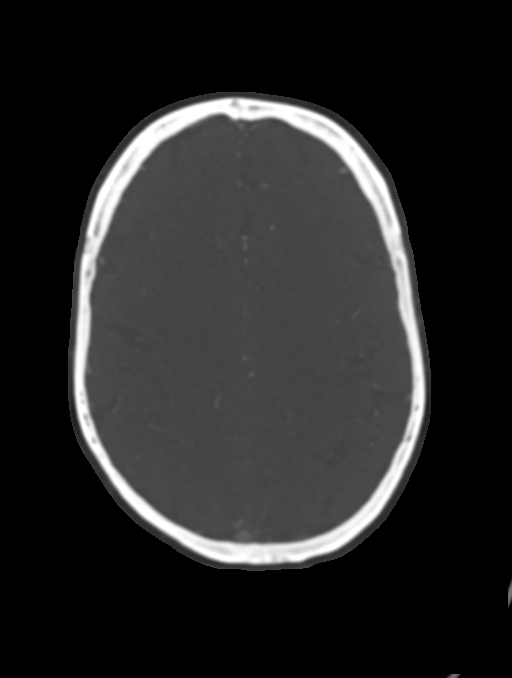

[6 of 33 positions shown; findings below may reference images not displayed]

FINDINGS: CTA NECK FINDINGS

Aortic arch: Visualized aortic arch of normal caliber with normal
branch pattern. Mild atheromatous plaque within the aortic arch
itself. No hemodynamically significant stenosis about the origin of
the great vessels. Visualized subclavian arteries widely patent.

Right carotid system: Right common and internal carotid arteries
widely patent without stenosis, dissection, or occlusion. Mild
atheromatous irregularity about the right carotid bifurcation
without stenosis.

Left carotid system: Left common carotid artery patent from its
origin to the bifurcation without stenosis. Mild calcified plaque
about the left bifurcation without hemodynamically significant
stenosis. Left ICA patent from the bifurcation to the skull base
without stenosis, dissection, or occlusion

Vertebral arteries: Both of the vertebral arteries arise from the
subclavian arteries. Right vertebral artery dominant. Vertebral
arteries widely patent within the neck without stenosis, dissection,
or occlusion.

Skeleton: No acute osseus abnormality. No worrisome lytic or blastic
osseous lesions. Moderate cervical spondylolysis at C6-7.

Other neck: No acute soft tissue abnormality within the neck.

Upper chest: 15 mm precarinal lymph node noted (series 7, image
169). Visualized upper chest and mediastinum otherwise within normal
limits. Partially visualized lungs are grossly clear. 4 mm nodule
present within the superior segment of the left lower lobe (series
7, image 158).

Review of the MIP images confirms the above findings

CTA HEAD FINDINGS

Anterior circulation: Petrous, cavernous, and supraclinoid segments
widely patent bilaterally without stenosis. A1 segments, anterior
communicating artery common anterior cerebral arteries widely patent
to their distal aspects without stenosis. M1 segments patent
bilaterally without stenosis. Normal MCA bifurcations. No proximal
M2 occlusion. Distal MCA branches well perfused and symmetric.

Posterior circulation: Vertebral arteries widely patent to the
vertebrobasilar junction. Right PICA patent. Left PICA not
visualized. Dominant left AICA. Basilar artery widely patent to its
distal aspect. Superior cerebral arteries patent bilaterally. Both
of the PCAs primarily supplied via the basilar. Mild atheromatous
irregularity within the mid-distal left P2 segment without
high-grade stenosis. Focal moderate proximal right P2 stenosis
(series 10, image 124). Right PCA otherwise widely patent to its
distal aspect.

Venous sinuses: Patent.

Anatomic variants: None significant.

Delayed phase: No abnormal enhancement. Posterior fossa incompletely
visualized.

Review of the MIP images confirms the above findings
IMPRESSION: 1. Negative CTA for large vessel occlusion.
2. Relatively minor atherosclerotic change involving the major
arterial vasculature of the neck for patient age. No hemodynamically
significant stenosis.
3. Atheromatous change involving the bilateral PCAs, with single
short-segment moderate proximal right P2 stenosis. Otherwise
negative intracranial CTA. No other hemodynamically significant or
correctable stenosis.
4. 4 mm left lower lobe pulmonary nodule, indeterminate. No
follow-up needed if patient is low-risk. Non-contrast chest CT can
be considered in 12 months if patient is high-risk. This
recommendation follows the consensus statement: Guidelines for
Management of Incidental Pulmonary Nodules Detected on CT Images:
5. 15 mm precarinal lymph node, nonspecific, but most commonly
reactive.

## 2019-09-01 DIAGNOSIS — H26491 Other secondary cataract, right eye: Secondary | ICD-10-CM | POA: Diagnosis not present

## 2019-09-29 DIAGNOSIS — E538 Deficiency of other specified B group vitamins: Secondary | ICD-10-CM | POA: Diagnosis not present

## 2019-10-06 DIAGNOSIS — H26492 Other secondary cataract, left eye: Secondary | ICD-10-CM | POA: Diagnosis not present

## 2019-10-12 DIAGNOSIS — Z1231 Encounter for screening mammogram for malignant neoplasm of breast: Secondary | ICD-10-CM | POA: Diagnosis not present

## 2019-10-20 DIAGNOSIS — H2703 Aphakia, bilateral: Secondary | ICD-10-CM | POA: Diagnosis not present

## 2019-10-20 DIAGNOSIS — H04123 Dry eye syndrome of bilateral lacrimal glands: Secondary | ICD-10-CM | POA: Diagnosis not present

## 2019-11-30 DIAGNOSIS — D510 Vitamin B12 deficiency anemia due to intrinsic factor deficiency: Secondary | ICD-10-CM | POA: Diagnosis not present

## 2019-12-01 DIAGNOSIS — G609 Hereditary and idiopathic neuropathy, unspecified: Secondary | ICD-10-CM | POA: Diagnosis not present

## 2020-02-01 DIAGNOSIS — Z23 Encounter for immunization: Secondary | ICD-10-CM | POA: Diagnosis not present

## 2020-02-01 DIAGNOSIS — E538 Deficiency of other specified B group vitamins: Secondary | ICD-10-CM | POA: Diagnosis not present

## 2020-03-28 DIAGNOSIS — D510 Vitamin B12 deficiency anemia due to intrinsic factor deficiency: Secondary | ICD-10-CM | POA: Diagnosis not present

## 2020-04-05 DIAGNOSIS — E7800 Pure hypercholesterolemia, unspecified: Secondary | ICD-10-CM | POA: Diagnosis not present

## 2020-04-05 DIAGNOSIS — I10 Essential (primary) hypertension: Secondary | ICD-10-CM | POA: Diagnosis not present

## 2020-04-05 DIAGNOSIS — M8580 Other specified disorders of bone density and structure, unspecified site: Secondary | ICD-10-CM | POA: Diagnosis not present

## 2020-04-05 DIAGNOSIS — K219 Gastro-esophageal reflux disease without esophagitis: Secondary | ICD-10-CM | POA: Diagnosis not present

## 2020-04-05 DIAGNOSIS — D510 Vitamin B12 deficiency anemia due to intrinsic factor deficiency: Secondary | ICD-10-CM | POA: Diagnosis not present

## 2020-04-18 DIAGNOSIS — R2681 Unsteadiness on feet: Secondary | ICD-10-CM | POA: Diagnosis not present

## 2020-04-18 DIAGNOSIS — Z23 Encounter for immunization: Secondary | ICD-10-CM | POA: Diagnosis not present

## 2020-04-18 DIAGNOSIS — G609 Hereditary and idiopathic neuropathy, unspecified: Secondary | ICD-10-CM | POA: Diagnosis not present

## 2020-04-18 DIAGNOSIS — I10 Essential (primary) hypertension: Secondary | ICD-10-CM | POA: Diagnosis not present

## 2020-04-20 DIAGNOSIS — M6281 Muscle weakness (generalized): Secondary | ICD-10-CM | POA: Diagnosis not present

## 2020-04-20 DIAGNOSIS — R2689 Other abnormalities of gait and mobility: Secondary | ICD-10-CM | POA: Diagnosis not present

## 2020-04-25 DIAGNOSIS — R2689 Other abnormalities of gait and mobility: Secondary | ICD-10-CM | POA: Diagnosis not present

## 2020-04-25 DIAGNOSIS — M6281 Muscle weakness (generalized): Secondary | ICD-10-CM | POA: Diagnosis not present

## 2020-04-27 DIAGNOSIS — R2689 Other abnormalities of gait and mobility: Secondary | ICD-10-CM | POA: Diagnosis not present

## 2020-04-27 DIAGNOSIS — M6281 Muscle weakness (generalized): Secondary | ICD-10-CM | POA: Diagnosis not present

## 2020-05-02 DIAGNOSIS — M6281 Muscle weakness (generalized): Secondary | ICD-10-CM | POA: Diagnosis not present

## 2020-05-02 DIAGNOSIS — R2689 Other abnormalities of gait and mobility: Secondary | ICD-10-CM | POA: Diagnosis not present

## 2020-05-10 DIAGNOSIS — R2689 Other abnormalities of gait and mobility: Secondary | ICD-10-CM | POA: Diagnosis not present

## 2020-05-10 DIAGNOSIS — M6281 Muscle weakness (generalized): Secondary | ICD-10-CM | POA: Diagnosis not present

## 2020-05-17 DIAGNOSIS — M6281 Muscle weakness (generalized): Secondary | ICD-10-CM | POA: Diagnosis not present

## 2020-05-17 DIAGNOSIS — R2689 Other abnormalities of gait and mobility: Secondary | ICD-10-CM | POA: Diagnosis not present

## 2020-05-19 DIAGNOSIS — M6281 Muscle weakness (generalized): Secondary | ICD-10-CM | POA: Diagnosis not present

## 2020-05-19 DIAGNOSIS — R2689 Other abnormalities of gait and mobility: Secondary | ICD-10-CM | POA: Diagnosis not present

## 2020-05-20 DIAGNOSIS — E7800 Pure hypercholesterolemia, unspecified: Secondary | ICD-10-CM | POA: Diagnosis not present

## 2020-05-20 DIAGNOSIS — I10 Essential (primary) hypertension: Secondary | ICD-10-CM | POA: Diagnosis not present

## 2020-05-20 DIAGNOSIS — D510 Vitamin B12 deficiency anemia due to intrinsic factor deficiency: Secondary | ICD-10-CM | POA: Diagnosis not present

## 2020-05-20 DIAGNOSIS — M8580 Other specified disorders of bone density and structure, unspecified site: Secondary | ICD-10-CM | POA: Diagnosis not present

## 2020-05-20 DIAGNOSIS — K219 Gastro-esophageal reflux disease without esophagitis: Secondary | ICD-10-CM | POA: Diagnosis not present

## 2020-05-24 DIAGNOSIS — R2689 Other abnormalities of gait and mobility: Secondary | ICD-10-CM | POA: Diagnosis not present

## 2020-05-24 DIAGNOSIS — M6281 Muscle weakness (generalized): Secondary | ICD-10-CM | POA: Diagnosis not present

## 2020-05-26 DIAGNOSIS — M6281 Muscle weakness (generalized): Secondary | ICD-10-CM | POA: Diagnosis not present

## 2020-05-26 DIAGNOSIS — R2689 Other abnormalities of gait and mobility: Secondary | ICD-10-CM | POA: Diagnosis not present

## 2020-05-30 DIAGNOSIS — D510 Vitamin B12 deficiency anemia due to intrinsic factor deficiency: Secondary | ICD-10-CM | POA: Diagnosis not present

## 2020-05-31 DIAGNOSIS — R2689 Other abnormalities of gait and mobility: Secondary | ICD-10-CM | POA: Diagnosis not present

## 2020-05-31 DIAGNOSIS — M6281 Muscle weakness (generalized): Secondary | ICD-10-CM | POA: Diagnosis not present

## 2020-06-07 DIAGNOSIS — R2689 Other abnormalities of gait and mobility: Secondary | ICD-10-CM | POA: Diagnosis not present

## 2020-06-07 DIAGNOSIS — M6281 Muscle weakness (generalized): Secondary | ICD-10-CM | POA: Diagnosis not present

## 2020-06-14 DIAGNOSIS — R2689 Other abnormalities of gait and mobility: Secondary | ICD-10-CM | POA: Diagnosis not present

## 2020-06-14 DIAGNOSIS — M6281 Muscle weakness (generalized): Secondary | ICD-10-CM | POA: Diagnosis not present

## 2020-06-21 DIAGNOSIS — R2689 Other abnormalities of gait and mobility: Secondary | ICD-10-CM | POA: Diagnosis not present

## 2020-06-21 DIAGNOSIS — M6281 Muscle weakness (generalized): Secondary | ICD-10-CM | POA: Diagnosis not present

## 2020-06-28 DIAGNOSIS — R2689 Other abnormalities of gait and mobility: Secondary | ICD-10-CM | POA: Diagnosis not present

## 2020-06-28 DIAGNOSIS — M6281 Muscle weakness (generalized): Secondary | ICD-10-CM | POA: Diagnosis not present

## 2020-07-05 DIAGNOSIS — R2689 Other abnormalities of gait and mobility: Secondary | ICD-10-CM | POA: Diagnosis not present

## 2020-07-05 DIAGNOSIS — M6281 Muscle weakness (generalized): Secondary | ICD-10-CM | POA: Diagnosis not present

## 2020-07-12 DIAGNOSIS — R2689 Other abnormalities of gait and mobility: Secondary | ICD-10-CM | POA: Diagnosis not present

## 2020-07-12 DIAGNOSIS — M6281 Muscle weakness (generalized): Secondary | ICD-10-CM | POA: Diagnosis not present

## 2020-07-19 DIAGNOSIS — M6281 Muscle weakness (generalized): Secondary | ICD-10-CM | POA: Diagnosis not present

## 2020-07-19 DIAGNOSIS — R2689 Other abnormalities of gait and mobility: Secondary | ICD-10-CM | POA: Diagnosis not present

## 2020-07-26 DIAGNOSIS — R2689 Other abnormalities of gait and mobility: Secondary | ICD-10-CM | POA: Diagnosis not present

## 2020-07-26 DIAGNOSIS — M6281 Muscle weakness (generalized): Secondary | ICD-10-CM | POA: Diagnosis not present

## 2020-08-01 DIAGNOSIS — D510 Vitamin B12 deficiency anemia due to intrinsic factor deficiency: Secondary | ICD-10-CM | POA: Diagnosis not present

## 2020-08-02 DIAGNOSIS — H04123 Dry eye syndrome of bilateral lacrimal glands: Secondary | ICD-10-CM | POA: Diagnosis not present

## 2020-08-02 DIAGNOSIS — H2703 Aphakia, bilateral: Secondary | ICD-10-CM | POA: Diagnosis not present

## 2020-10-04 DIAGNOSIS — D510 Vitamin B12 deficiency anemia due to intrinsic factor deficiency: Secondary | ICD-10-CM | POA: Diagnosis not present

## 2020-10-24 DIAGNOSIS — Z1231 Encounter for screening mammogram for malignant neoplasm of breast: Secondary | ICD-10-CM | POA: Diagnosis not present

## 2020-11-09 DIAGNOSIS — R921 Mammographic calcification found on diagnostic imaging of breast: Secondary | ICD-10-CM | POA: Diagnosis not present

## 2020-11-09 DIAGNOSIS — R928 Other abnormal and inconclusive findings on diagnostic imaging of breast: Secondary | ICD-10-CM | POA: Diagnosis not present

## 2020-11-09 DIAGNOSIS — R922 Inconclusive mammogram: Secondary | ICD-10-CM | POA: Diagnosis not present

## 2020-11-15 DIAGNOSIS — H04123 Dry eye syndrome of bilateral lacrimal glands: Secondary | ICD-10-CM | POA: Diagnosis not present

## 2020-11-15 DIAGNOSIS — Z23 Encounter for immunization: Secondary | ICD-10-CM | POA: Diagnosis not present

## 2020-11-28 DIAGNOSIS — I10 Essential (primary) hypertension: Secondary | ICD-10-CM | POA: Diagnosis not present

## 2020-11-28 DIAGNOSIS — Z789 Other specified health status: Secondary | ICD-10-CM | POA: Diagnosis not present

## 2020-11-28 DIAGNOSIS — Z0000 Encounter for general adult medical examination without abnormal findings: Secondary | ICD-10-CM | POA: Diagnosis not present

## 2020-11-28 DIAGNOSIS — Z23 Encounter for immunization: Secondary | ICD-10-CM | POA: Diagnosis not present

## 2020-11-28 DIAGNOSIS — R2681 Unsteadiness on feet: Secondary | ICD-10-CM | POA: Diagnosis not present

## 2020-11-28 DIAGNOSIS — G609 Hereditary and idiopathic neuropathy, unspecified: Secondary | ICD-10-CM | POA: Diagnosis not present

## 2020-11-28 DIAGNOSIS — D510 Vitamin B12 deficiency anemia due to intrinsic factor deficiency: Secondary | ICD-10-CM | POA: Diagnosis not present

## 2020-11-28 DIAGNOSIS — E7800 Pure hypercholesterolemia, unspecified: Secondary | ICD-10-CM | POA: Diagnosis not present

## 2020-11-28 DIAGNOSIS — Z1389 Encounter for screening for other disorder: Secondary | ICD-10-CM | POA: Diagnosis not present

## 2020-11-29 DIAGNOSIS — N6012 Diffuse cystic mastopathy of left breast: Secondary | ICD-10-CM | POA: Diagnosis not present

## 2020-11-29 DIAGNOSIS — R921 Mammographic calcification found on diagnostic imaging of breast: Secondary | ICD-10-CM | POA: Diagnosis not present

## 2021-01-30 DIAGNOSIS — D510 Vitamin B12 deficiency anemia due to intrinsic factor deficiency: Secondary | ICD-10-CM | POA: Diagnosis not present

## 2021-01-30 DIAGNOSIS — Z23 Encounter for immunization: Secondary | ICD-10-CM | POA: Diagnosis not present

## 2021-04-04 DIAGNOSIS — D510 Vitamin B12 deficiency anemia due to intrinsic factor deficiency: Secondary | ICD-10-CM | POA: Diagnosis not present

## 2021-06-01 DIAGNOSIS — G609 Hereditary and idiopathic neuropathy, unspecified: Secondary | ICD-10-CM | POA: Diagnosis not present

## 2021-06-01 DIAGNOSIS — I6930 Unspecified sequelae of cerebral infarction: Secondary | ICD-10-CM | POA: Diagnosis not present

## 2021-06-01 DIAGNOSIS — D510 Vitamin B12 deficiency anemia due to intrinsic factor deficiency: Secondary | ICD-10-CM | POA: Diagnosis not present

## 2021-06-01 DIAGNOSIS — R2681 Unsteadiness on feet: Secondary | ICD-10-CM | POA: Diagnosis not present

## 2021-06-01 DIAGNOSIS — I10 Essential (primary) hypertension: Secondary | ICD-10-CM | POA: Diagnosis not present

## 2021-06-01 DIAGNOSIS — M8580 Other specified disorders of bone density and structure, unspecified site: Secondary | ICD-10-CM | POA: Diagnosis not present

## 2021-06-08 ENCOUNTER — Other Ambulatory Visit: Payer: Self-pay | Admitting: Internal Medicine

## 2021-06-08 DIAGNOSIS — M8580 Other specified disorders of bone density and structure, unspecified site: Secondary | ICD-10-CM

## 2021-06-20 DIAGNOSIS — M85851 Other specified disorders of bone density and structure, right thigh: Secondary | ICD-10-CM | POA: Diagnosis not present

## 2021-06-20 DIAGNOSIS — M85852 Other specified disorders of bone density and structure, left thigh: Secondary | ICD-10-CM | POA: Diagnosis not present

## 2021-06-20 DIAGNOSIS — Z780 Asymptomatic menopausal state: Secondary | ICD-10-CM | POA: Diagnosis not present

## 2021-07-13 DIAGNOSIS — K5901 Slow transit constipation: Secondary | ICD-10-CM | POA: Diagnosis not present

## 2021-07-31 DIAGNOSIS — K5901 Slow transit constipation: Secondary | ICD-10-CM | POA: Diagnosis not present

## 2021-07-31 DIAGNOSIS — E538 Deficiency of other specified B group vitamins: Secondary | ICD-10-CM | POA: Diagnosis not present

## 2021-08-28 DIAGNOSIS — H04123 Dry eye syndrome of bilateral lacrimal glands: Secondary | ICD-10-CM | POA: Diagnosis not present

## 2021-08-28 DIAGNOSIS — H2703 Aphakia, bilateral: Secondary | ICD-10-CM | POA: Diagnosis not present

## 2021-10-03 DIAGNOSIS — E538 Deficiency of other specified B group vitamins: Secondary | ICD-10-CM | POA: Diagnosis not present

## 2021-12-05 DIAGNOSIS — D510 Vitamin B12 deficiency anemia due to intrinsic factor deficiency: Secondary | ICD-10-CM | POA: Diagnosis not present

## 2021-12-05 DIAGNOSIS — Z0000 Encounter for general adult medical examination without abnormal findings: Secondary | ICD-10-CM | POA: Diagnosis not present

## 2021-12-05 DIAGNOSIS — Z1331 Encounter for screening for depression: Secondary | ICD-10-CM | POA: Diagnosis not present

## 2021-12-05 DIAGNOSIS — E7800 Pure hypercholesterolemia, unspecified: Secondary | ICD-10-CM | POA: Diagnosis not present

## 2021-12-05 DIAGNOSIS — G609 Hereditary and idiopathic neuropathy, unspecified: Secondary | ICD-10-CM | POA: Diagnosis not present

## 2021-12-05 DIAGNOSIS — M8580 Other specified disorders of bone density and structure, unspecified site: Secondary | ICD-10-CM | POA: Diagnosis not present

## 2021-12-05 DIAGNOSIS — K219 Gastro-esophageal reflux disease without esophagitis: Secondary | ICD-10-CM | POA: Diagnosis not present

## 2021-12-05 DIAGNOSIS — I6930 Unspecified sequelae of cerebral infarction: Secondary | ICD-10-CM | POA: Diagnosis not present

## 2021-12-05 DIAGNOSIS — M7062 Trochanteric bursitis, left hip: Secondary | ICD-10-CM | POA: Diagnosis not present

## 2021-12-05 DIAGNOSIS — I10 Essential (primary) hypertension: Secondary | ICD-10-CM | POA: Diagnosis not present

## 2021-12-11 DIAGNOSIS — Z1231 Encounter for screening mammogram for malignant neoplasm of breast: Secondary | ICD-10-CM | POA: Diagnosis not present

## 2022-01-17 DIAGNOSIS — E7800 Pure hypercholesterolemia, unspecified: Secondary | ICD-10-CM | POA: Diagnosis not present

## 2022-01-17 DIAGNOSIS — Z5181 Encounter for therapeutic drug level monitoring: Secondary | ICD-10-CM | POA: Diagnosis not present

## 2022-02-06 DIAGNOSIS — Z23 Encounter for immunization: Secondary | ICD-10-CM | POA: Diagnosis not present

## 2022-02-06 DIAGNOSIS — E538 Deficiency of other specified B group vitamins: Secondary | ICD-10-CM | POA: Diagnosis not present

## 2022-04-10 DIAGNOSIS — E538 Deficiency of other specified B group vitamins: Secondary | ICD-10-CM | POA: Diagnosis not present

## 2022-06-21 DIAGNOSIS — E7800 Pure hypercholesterolemia, unspecified: Secondary | ICD-10-CM | POA: Diagnosis not present

## 2022-06-21 DIAGNOSIS — D510 Vitamin B12 deficiency anemia due to intrinsic factor deficiency: Secondary | ICD-10-CM | POA: Diagnosis not present

## 2022-06-21 DIAGNOSIS — I6930 Unspecified sequelae of cerebral infarction: Secondary | ICD-10-CM | POA: Diagnosis not present

## 2022-06-21 DIAGNOSIS — M8580 Other specified disorders of bone density and structure, unspecified site: Secondary | ICD-10-CM | POA: Diagnosis not present

## 2022-06-21 DIAGNOSIS — G609 Hereditary and idiopathic neuropathy, unspecified: Secondary | ICD-10-CM | POA: Diagnosis not present

## 2022-06-21 DIAGNOSIS — I10 Essential (primary) hypertension: Secondary | ICD-10-CM | POA: Diagnosis not present

## 2022-08-20 DIAGNOSIS — E538 Deficiency of other specified B group vitamins: Secondary | ICD-10-CM | POA: Diagnosis not present

## 2022-09-03 DIAGNOSIS — H2703 Aphakia, bilateral: Secondary | ICD-10-CM | POA: Diagnosis not present

## 2022-09-03 DIAGNOSIS — H04123 Dry eye syndrome of bilateral lacrimal glands: Secondary | ICD-10-CM | POA: Diagnosis not present

## 2022-10-15 DIAGNOSIS — E538 Deficiency of other specified B group vitamins: Secondary | ICD-10-CM | POA: Diagnosis not present

## 2022-12-11 DIAGNOSIS — Z79899 Other long term (current) drug therapy: Secondary | ICD-10-CM | POA: Diagnosis not present

## 2022-12-11 DIAGNOSIS — E7800 Pure hypercholesterolemia, unspecified: Secondary | ICD-10-CM | POA: Diagnosis not present

## 2022-12-11 DIAGNOSIS — G729 Myopathy, unspecified: Secondary | ICD-10-CM | POA: Diagnosis not present

## 2022-12-11 DIAGNOSIS — M8580 Other specified disorders of bone density and structure, unspecified site: Secondary | ICD-10-CM | POA: Diagnosis not present

## 2022-12-11 DIAGNOSIS — Z0000 Encounter for general adult medical examination without abnormal findings: Secondary | ICD-10-CM | POA: Diagnosis not present

## 2022-12-11 DIAGNOSIS — M8589 Other specified disorders of bone density and structure, multiple sites: Secondary | ICD-10-CM | POA: Diagnosis not present

## 2022-12-11 DIAGNOSIS — I6930 Unspecified sequelae of cerebral infarction: Secondary | ICD-10-CM | POA: Diagnosis not present

## 2022-12-11 DIAGNOSIS — R296 Repeated falls: Secondary | ICD-10-CM | POA: Diagnosis not present

## 2022-12-11 DIAGNOSIS — D510 Vitamin B12 deficiency anemia due to intrinsic factor deficiency: Secondary | ICD-10-CM | POA: Diagnosis not present

## 2022-12-11 DIAGNOSIS — E538 Deficiency of other specified B group vitamins: Secondary | ICD-10-CM | POA: Diagnosis not present

## 2022-12-11 DIAGNOSIS — I10 Essential (primary) hypertension: Secondary | ICD-10-CM | POA: Diagnosis not present

## 2022-12-11 DIAGNOSIS — Z1331 Encounter for screening for depression: Secondary | ICD-10-CM | POA: Diagnosis not present

## 2022-12-11 DIAGNOSIS — G609 Hereditary and idiopathic neuropathy, unspecified: Secondary | ICD-10-CM | POA: Diagnosis not present

## 2022-12-17 DIAGNOSIS — Z1231 Encounter for screening mammogram for malignant neoplasm of breast: Secondary | ICD-10-CM | POA: Diagnosis not present

## 2023-02-11 DIAGNOSIS — Z23 Encounter for immunization: Secondary | ICD-10-CM | POA: Diagnosis not present

## 2023-02-11 DIAGNOSIS — E538 Deficiency of other specified B group vitamins: Secondary | ICD-10-CM | POA: Diagnosis not present

## 2023-04-16 DIAGNOSIS — E538 Deficiency of other specified B group vitamins: Secondary | ICD-10-CM | POA: Diagnosis not present

## 2023-04-25 DIAGNOSIS — M546 Pain in thoracic spine: Secondary | ICD-10-CM | POA: Diagnosis not present

## 2023-04-25 DIAGNOSIS — Z043 Encounter for examination and observation following other accident: Secondary | ICD-10-CM | POA: Diagnosis not present

## 2024-05-01 ENCOUNTER — Ambulatory Visit (HOSPITAL_BASED_OUTPATIENT_CLINIC_OR_DEPARTMENT_OTHER)
Admission: EM | Admit: 2024-05-01 | Discharge: 2024-05-01 | Disposition: A | Discharge status: Home / Self Care | Attending: Family Medicine | Admitting: Family Medicine

## 2024-05-01 ENCOUNTER — Other Ambulatory Visit (HOSPITAL_BASED_OUTPATIENT_CLINIC_OR_DEPARTMENT_OTHER): Payer: Self-pay

## 2024-05-01 ENCOUNTER — Encounter (HOSPITAL_BASED_OUTPATIENT_CLINIC_OR_DEPARTMENT_OTHER): Payer: Self-pay

## 2024-05-01 DIAGNOSIS — R051 Acute cough: Secondary | ICD-10-CM

## 2024-05-01 DIAGNOSIS — J069 Acute upper respiratory infection, unspecified: Secondary | ICD-10-CM | POA: Insufficient documentation

## 2024-05-01 DIAGNOSIS — J029 Acute pharyngitis, unspecified: Secondary | ICD-10-CM

## 2024-05-01 DIAGNOSIS — B9789 Other viral agents as the cause of diseases classified elsewhere: Secondary | ICD-10-CM | POA: Diagnosis not present

## 2024-05-01 LAB — POCT INFLUENZA A/B
Influenza A, POC: NEGATIVE
Influenza B, POC: NEGATIVE

## 2024-05-01 LAB — POCT RAPID STREP A (OFFICE): Rapid Strep A Screen: NEGATIVE

## 2024-05-01 LAB — POC SOFIA SARS ANTIGEN FIA: SARS Coronavirus 2 Ag: NEGATIVE

## 2024-05-01 MED ORDER — PROMETHAZINE-DM 6.25-15 MG/5ML PO SYRP
5.0000 mL | ORAL_SOLUTION | Freq: Four times a day (QID) | ORAL | 0 refills | Status: AC | PRN
  Filled 2024-05-01: qty 118, 6d supply, fill #0

## 2024-05-01 NOTE — Discharge Instructions (Signed)
 Viral upper respiratory infection with cough and sore throat: Rapid strep, rapid COVID, rapid flu were all negative.  Throat culture sent.  Will adjust the plan of care, if needed once the culture results.  Use acetaminophen , 650-750 mg every 4-6 hours if needed for throat pain, body aches or fever.  Get plenty of fluids and rest.  Promethazine  DM, 2.5 mL, every 6 hours, if needed for cough or congestion.  Follow-up if symptoms do not improve, worsen or new symptoms occur.

## 2024-05-01 NOTE — ED Provider Notes (Signed)
 " [facility]    CSN: [id number] Arrival date & time: 05/01/24  9048      History   Chief Complaint Chief Complaint  Patient presents with   Sore Throat   Nasal Congestion    HPI Ashley Phillips is a 89 y.o. female.   89 year old female with complaint of sore throat, postnasal drip, cough that started on 04/29/2024.  She actually feels a little bit better today but she is having some bodyaches.  She has a productive cough.  Her sputum is mostly white.  She denies fever, nausea, vomiting, constipation, diarrhea.  The patient's husband is in a nursing home and she is concerned that maybe she picked up something at the nursing home.  She is concerned about having the flu, COVID or even strep throat.   Sore Throat Pertinent negatives include no chest pain, no abdominal pain and no shortness of breath.    Past Medical History:  Diagnosis Date   Hypertension    Idiopathic neuropathy    Stroke [facility])     Patient Active Problem List   Diagnosis Date Noted   CVA (cerebral vascular accident) (HCC) 01/02/2018    Past Surgical History:  Procedure Laterality Date   LAPAROSCOPIC ASSISTED VAGINAL HYSTERECTOMY      OB History   No obstetric history on file.      Home Medications    Prior to Admission medications  Medication Sig Start Date End Date Taking? Authorizing Provider  gabapentin (NEURONTIN) 100 MG capsule Take 100 mg by mouth 2 (two) times daily. 03/05/24  Yes [provider]  promethazine -dextromethorphan (PROMETHAZINE -DM) 6.25-15 MG/5ML syrup Take 5 mLs by mouth 4 (four) times daily as needed for cough. Do not use and drive - May make drowsy. 05/01/24  Yes Ival Domino, FNP  aspirin  EC 325 MG tablet Take 1 tablet (325 mg total) by mouth daily. 01/25/18   Akula, Vijaya, MD  atorvastatin  (LIPITOR ) 20 MG tablet Take 1 tablet (20 mg total) by mouth daily at 6 PM. 01/03/18   Cherlyn Labella, MD  Calcium  Carb-Cholecalciferol  (OS-CAL EXTRA D3) 500-600  MG-UNIT TABS Take 1 tablet by mouth daily.    [provider]  cholecalciferol  (VITAMIN D ) 1000 units tablet Take 1,000 Units by mouth daily.    [provider]  fluticasone (FLONASE) 50 MCG/ACT nasal spray Place 2 sprays into both nostrils daily as needed for allergies or rhinitis.    [provider]  loratadine  (CLARITIN ) 10 MG tablet Take 10 mg by mouth daily as needed for allergies.     [provider]  losartan-hydrochlorothiazide (HYZAAR) 100-12.5 MG tablet Take 1 tablet by mouth daily.    [provider]  metoprolol  succinate (TOPROL -XL) 100 MG 24 hr tablet Take 100 mg by mouth daily.    [provider]    Family History Family History  Problem Relation Age of Onset   Hypertension Mother    Hypertension Father     Social History Social History[1]   Allergies   Lisinopril and Prednisone   Review of Systems Review of Systems  Constitutional:  Negative for chills and fever.  HENT:  Positive for congestion, postnasal drip, rhinorrhea and sore throat. Negative for ear pain.   Eyes:  Negative for pain and visual disturbance.  Respiratory:  Positive for cough. Negative for shortness of breath.   Cardiovascular:  Negative for chest pain and palpitations.  Gastrointestinal:  Negative for abdominal pain, constipation, diarrhea, nausea and vomiting.  Genitourinary:  Negative  for dysuria and hematuria.  Musculoskeletal:  Negative for arthralgias and back pain.  Skin:  Negative for color change and rash.  Neurological:  Negative for seizures and syncope.  All other systems reviewed and are negative.    Physical Exam Triage Vital Signs ED Triage Vitals  Encounter Vitals Group     BP 05/01/24 1102 109/63     Girls Systolic BP Percentile --      Girls Diastolic BP Percentile --      Boys Systolic BP Percentile --      Boys Diastolic BP Percentile --      Pulse Rate 05/01/24 1102 73     Resp 05/01/24 1102 20     Temp  05/01/24 1102 98.5 F (36.9 C)     Temp src --      SpO2 05/01/24 1102 95 %     Weight --      Height --      Head Circumference --      Peak Flow --      Pain Score 05/01/24 1104 8     Pain Loc --      Pain Education --      Exclude from Growth Chart --    No data found.  Updated Vital Signs BP 109/63 (BP Location: Left Arm)   Pulse 73   Temp 98.5 F (36.9 C)   Resp 20   SpO2 95%   Visual Acuity Right Eye Distance:   Left Eye Distance:   Bilateral Distance:    Right Eye Near:   Left Eye Near:    Bilateral Near:     Physical Exam Vitals and nursing note reviewed.  Constitutional:      General: She is not in acute distress.    Appearance: She is well-developed. She is not ill-appearing, toxic-appearing or diaphoretic.     Comments: Patient uses a cane so she was brought back in a wheelchair to make it easier to move her to the room.  HENT:     Head: Normocephalic and atraumatic.     Right Ear: Hearing, tympanic membrane, ear canal and external ear normal.     Left Ear: Hearing, tympanic membrane, ear canal and external ear normal.     Nose: Congestion and rhinorrhea present.     Right Sinus: No maxillary sinus tenderness or frontal sinus tenderness.     Left Sinus: No maxillary sinus tenderness or frontal sinus tenderness.     Mouth/Throat:     Lips: Pink.     Mouth: Mucous membranes are moist.     Pharynx: Uvula midline. Posterior oropharyngeal erythema present. No oropharyngeal exudate.     Tonsils: No tonsillar exudate (No enlargement nor exudate but some erythema.).  Eyes:     Conjunctiva/sclera: Conjunctivae normal.     Pupils: Pupils are equal, round, and reactive to light.  Cardiovascular:     Rate and Rhythm: Normal rate and regular rhythm.     Heart sounds: S1 normal and S2 normal. No murmur heard. Pulmonary:     Effort: Pulmonary effort is normal. No respiratory distress.     Breath sounds: Normal breath sounds. No decreased breath sounds, wheezing,  rhonchi or rales.  Abdominal:     General: Bowel sounds are normal.     Palpations: Abdomen is soft.     Tenderness: There is no abdominal tenderness.  Musculoskeletal:        General: No swelling.     Cervical back: Neck supple.  Lymphadenopathy:     Head:     Right side of head: No submental, submandibular, tonsillar, preauricular or posterior auricular adenopathy.     Left side of head: No submental, submandibular, tonsillar, preauricular or posterior auricular adenopathy.     Cervical: Cervical adenopathy present.     Right cervical: Superficial cervical adenopathy present.     Left cervical: Superficial cervical adenopathy present.  Skin:    General: Skin is warm and dry.     Capillary Refill: Capillary refill takes less than 2 seconds.     Findings: No rash.  Neurological:     Mental Status: She is alert and oriented to person, place, and time.     Gait: Gait abnormal (Walks with a cane).  Psychiatric:        Mood and Affect: Mood normal.      UC Treatments / Results  Labs (all labs ordered are listed, but only abnormal results are displayed) Labs Reviewed  POCT INFLUENZA A/B - Normal  POCT RAPID STREP A (OFFICE) - Normal  POC SOFIA SARS ANTIGEN FIA - Normal  CULTURE, GROUP A STREP [facility])    EKG   Radiology No results found.  Procedures Procedures (including critical care time)  Medications Ordered in UC Medications - No data to display  Initial Impression / Assessment and Plan / UC Course  I have reviewed the triage vital signs and the nursing notes.  Pertinent labs & imaging results that were available during my care of the patient were reviewed by me and considered in my medical decision making (see chart for details).  Plan of Care (see discharge instructions for additional patient precautions and education): Viral upper respiratory infection with cough and sore throat: Rapid strep, rapid COVID, rapid flu were all negative.  Throat culture sent.  Will  adjust the plan of care, if needed once the culture results.  Use acetaminophen , 650-750 mg every 4-6 hours if needed for throat pain, body aches or fever.  Get plenty of fluids and rest.  Promethazine  DM, 2.5 mL, every 6 hours, if needed for cough or congestion.  Follow-up if symptoms do not improve, worsen or new symptoms occur.  I reviewed the plan of care with the patient and/or the patient's guardian.  The patient and/or guardian had time to ask questions and acknowledged that the questions were answered.  Final Clinical Impressions(s) / UC Diagnoses   Final diagnoses:  Acute cough  Sore throat  Viral URI     Discharge Instructions      Viral upper respiratory infection with cough and sore throat: Rapid strep, rapid COVID, rapid flu were all negative.  Throat culture sent.  Will adjust the plan of care, if needed once the culture results.  Use acetaminophen , 650-750 mg every 4-6 hours if needed for throat pain, body aches or fever.  Get plenty of fluids and rest.  Promethazine  DM, 2.5 mL, every 6 hours, if needed for cough or congestion.  Follow-up if symptoms do not improve, worsen or new symptoms occur.       ED Prescriptions     Medication Sig Dispense Auth. Provider   promethazine -dextromethorphan (PROMETHAZINE -DM) 6.25-15 MG/5ML syrup Take 5 mLs by mouth 4 (four) times daily as needed for cough. Do not use and drive - May make drowsy. 118 mL Ival Domino, FNP      PDMP not reviewed this encounter.    [1]  Social History Tobacco Use   Smoking status: Never   Smokeless tobacco: Never  Substance Use Topics   Alcohol use: Never   Drug use: Not Currently     Ival Domino, FNP 05/01/24 1238  "

## 2024-05-01 NOTE — ED Triage Notes (Signed)
 Sore throat, post nasal drip causing cough onset Christmas Eve. States slightly improved today but still sore. States cough is productive.

## 2024-05-04 ENCOUNTER — Ambulatory Visit (HOSPITAL_COMMUNITY): Payer: Self-pay

## 2024-05-04 LAB — CULTURE, GROUP A STREP (THRC)

## 2024-05-05 NOTE — Progress Notes (Signed)
 Negative.  No strep throat.  Patient updated and states she is feeling some better but still has a hoarse voice.  She will see her Primary Care or return to UC if her symptoms to not resolve, worsen or if new symptoms occur.
